# Patient Record
Sex: Male | Born: 1985 | Race: Black or African American | Hispanic: No | Marital: Single | Smoking: Current every day smoker
Health system: Southern US, Community
[De-identification: ages and names within clinical notes are randomized; demographics above are authoritative.]

## PROBLEM LIST (undated history)

## (undated) DIAGNOSIS — F319 Bipolar disorder, unspecified: Secondary | ICD-10-CM

## (undated) DIAGNOSIS — F4310 Post-traumatic stress disorder, unspecified: Secondary | ICD-10-CM

## (undated) DIAGNOSIS — F259 Schizoaffective disorder, unspecified: Secondary | ICD-10-CM

---
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
## 2007-08-09 ENCOUNTER — Emergency Department (HOSPITAL_COMMUNITY): Admission: EM | Admit: 2007-08-09 | Discharge: 2007-08-09 | Payer: Self-pay | Admitting: Emergency Medicine

## 2011-11-27 ENCOUNTER — Emergency Department (HOSPITAL_COMMUNITY): Payer: Self-pay

## 2011-11-27 ENCOUNTER — Emergency Department (HOSPITAL_COMMUNITY)
Admission: EM | Admit: 2011-11-27 | Discharge: 2011-11-27 | Disposition: A | Discharge status: Home / Self Care | Payer: Self-pay | Attending: Emergency Medicine | Admitting: Emergency Medicine

## 2011-11-27 ENCOUNTER — Encounter (HOSPITAL_COMMUNITY): Payer: Self-pay | Admitting: *Deleted

## 2011-11-27 DIAGNOSIS — J189 Pneumonia, unspecified organism: Secondary | ICD-10-CM

## 2011-11-27 DIAGNOSIS — F17200 Nicotine dependence, unspecified, uncomplicated: Secondary | ICD-10-CM | POA: Insufficient documentation

## 2011-11-27 DIAGNOSIS — B9789 Other viral agents as the cause of diseases classified elsewhere: Secondary | ICD-10-CM | POA: Insufficient documentation

## 2011-11-27 LAB — RAPID STREP SCREEN (MED CTR MEBANE ONLY): Streptococcus, Group A Screen (Direct): NEGATIVE

## 2011-11-27 MED ORDER — AZITHROMYCIN 250 MG PO TABS: 250.0000 mg | ORAL_TABLET | Freq: Every day | ORAL | Status: AC

## 2011-11-27 MED ORDER — SODIUM CHLORIDE 0.9 % IV BOLUS (SEPSIS)
1000.0000 mL | Freq: Once | INTRAVENOUS | Status: AC
  Administered 2011-11-27: 1000 mL via INTRAVENOUS

## 2011-11-27 MED ORDER — ACETAMINOPHEN 325 MG PO TABS
650.0000 mg | ORAL_TABLET | Freq: Once | ORAL | Status: AC
  Administered 2011-11-27: 650 mg via ORAL

## 2011-11-27 MED ORDER — AZITHROMYCIN 250 MG PO TABS
500.0000 mg | ORAL_TABLET | Freq: Once | ORAL | Status: AC
  Administered 2011-11-27: 500 mg via ORAL
  Filled 2011-11-27: qty 2

## 2011-11-27 MED ORDER — DEXTROSE 5 % IV SOLN
1.0000 g | Freq: Once | INTRAVENOUS | Status: AC
  Administered 2011-11-27: 1 g via INTRAVENOUS
  Filled 2011-11-27: qty 10

## 2011-11-27 MED ORDER — ACETAMINOPHEN 325 MG PO TABS
ORAL_TABLET | ORAL | Status: AC
  Filled 2011-11-27: qty 2

## 2011-11-27 MED ORDER — IBUPROFEN 600 MG PO TABS: 600.0000 mg | ORAL_TABLET | Freq: Four times a day (QID) | ORAL | Status: AC | PRN

## 2011-11-27 NOTE — ED Notes (Signed)
Pt c/o HA, fever, productive cough for the past 2 days.  States sputum is green.  Denies vomiting, SOB, CP.

## 2011-11-27 NOTE — ED Provider Notes (Signed)
History     CSN: [id number]  Arrival date & time 11/27/11  1944   First MD Initiated Contact with Patient 11/27/11 2007      Chief Complaint  Patient presents with  . URI  . Headache    (Consider location/radiation/quality/duration/timing/severity/associated sxs/prior treatment) HPI Comments: Patient states he's had fever, headache, myalgias, sore throat, nonproductive cough for the past 2, days.  He's been taking some unknown.  Over-the-counter medication without relief.  Last time.  He took any of this medication with 10 AM this morning.  He, states he's been eating, and drinking okay.  No nausea, vomiting, diarrhea.  He attempted to work today, but was unable to complete his symptoms  Patient is a 26 y.o. male presenting with URI and headaches. The history is provided by the patient.  URI The primary symptoms include fever, headaches, sore throat, cough and myalgias. Primary symptoms do not include nausea, vomiting or rash. The current episode started 2 days ago. This is a new problem.  Symptoms associated with the illness include congestion.  Headache  Associated symptoms include a fever. Pertinent negatives include no nausea and no vomiting.    History reviewed. No pertinent past medical history.  History reviewed. No pertinent past surgical history.  History reviewed. No pertinent family history.  History  Substance Use Topics  . Smoking status: Current Everyday Smoker -- 1.0 packs/day  . Smokeless tobacco: Not on file  . Alcohol Use: Yes      Review of Systems  Constitutional: Positive for fever.  HENT: Positive for congestion and sore throat.   Respiratory: Positive for cough.   Gastrointestinal: Negative for nausea and vomiting.  Genitourinary: Negative for dysuria.  Musculoskeletal: Positive for myalgias.  Skin: Negative for rash.  Neurological: Positive for dizziness and headaches.    Allergies  Review of patient's allergies indicates no known  allergies.  Home Medications   Current Outpatient Rx  Name Route Sig Dispense Refill  . IBUPROFEN 200 MG PO TABS Oral Take 400 mg by mouth every 6 (six) hours as needed. For pain      BP 146/88  Pulse 84  Temp 100.8 F (38.2 C)  Resp 16  SpO2 98%  Physical Exam  Constitutional: He is oriented to person, place, and time.  HENT:  Head: Normocephalic.  Mouth/Throat: Posterior oropharyngeal edema and posterior oropharyngeal erythema present. No oropharyngeal exudate.  Cardiovascular: Normal rate.   Pulmonary/Chest: Effort normal.  Musculoskeletal: Normal range of motion.  Neurological: He is alert and oriented to person, place, and time.  Skin: Skin is warm. No rash noted.    ED Course  Procedures (including critical care time)   Labs Reviewed  RAPID STREP SCREEN   No results found.   No diagnosis found.    MDM   That this is just a viral syndrome.  X-ray has been ordered through triage lungs sound with no wheezing, or diminished breath sounds.  I will hydrate, and treat with Tylenol, and reevaluates are clear        Arman Filter, NP 12/03/11 0211

## 2011-12-04 NOTE — ED Provider Notes (Signed)
Medical screening examination/treatment/procedure(s) were performed by non-physician practitioner and as supervising physician I was immediately available for consultation/collaboration.   Richardean Canal, MD 12/04/11 [phone]

## 2011-12-15 ENCOUNTER — Encounter (HOSPITAL_COMMUNITY): Payer: Self-pay | Admitting: Emergency Medicine

## 2011-12-15 ENCOUNTER — Emergency Department (HOSPITAL_COMMUNITY)
Admission: EM | Admit: 2011-12-15 | Discharge: 2011-12-15 | Disposition: A | Discharge status: Home / Self Care | Payer: No Typology Code available for payment source | Attending: Emergency Medicine | Admitting: Emergency Medicine

## 2011-12-15 ENCOUNTER — Emergency Department (HOSPITAL_COMMUNITY): Payer: Self-pay

## 2011-12-15 DIAGNOSIS — F17200 Nicotine dependence, unspecified, uncomplicated: Secondary | ICD-10-CM | POA: Insufficient documentation

## 2011-12-15 DIAGNOSIS — S139XXA Sprain of joints and ligaments of unspecified parts of neck, initial encounter: Secondary | ICD-10-CM | POA: Insufficient documentation

## 2011-12-15 DIAGNOSIS — S161XXA Strain of muscle, fascia and tendon at neck level, initial encounter: Secondary | ICD-10-CM

## 2011-12-15 DIAGNOSIS — Y92410 Unspecified street and highway as the place of occurrence of the external cause: Secondary | ICD-10-CM | POA: Insufficient documentation

## 2011-12-15 DIAGNOSIS — S335XXA Sprain of ligaments of lumbar spine, initial encounter: Secondary | ICD-10-CM | POA: Insufficient documentation

## 2011-12-15 DIAGNOSIS — S39012A Strain of muscle, fascia and tendon of lower back, initial encounter: Secondary | ICD-10-CM

## 2011-12-15 MED ORDER — IBUPROFEN 800 MG PO TABS: 800.0000 mg | ORAL_TABLET | Freq: Three times a day (TID) | ORAL | Status: AC | PRN
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
## 2011-12-16 NOTE — ED Provider Notes (Signed)
Medical screening examination/treatment/procedure(s) were performed by non-physician practitioner and as supervising physician I was immediately available for consultation/collaboration.   Lyanne Co, MD 12/16/11 [phone]

## 2013-09-03 ENCOUNTER — Emergency Department (HOSPITAL_COMMUNITY)
Admission: EM | Admit: 2013-09-03 | Discharge: 2013-09-03 | Disposition: A | Discharge status: Home / Self Care | Payer: No Typology Code available for payment source | Attending: Emergency Medicine | Admitting: Emergency Medicine

## 2013-09-03 ENCOUNTER — Encounter (HOSPITAL_COMMUNITY): Payer: Self-pay | Admitting: Emergency Medicine

## 2013-09-03 DIAGNOSIS — S61459A Open bite of unspecified hand, initial encounter: Secondary | ICD-10-CM

## 2013-09-03 DIAGNOSIS — F17200 Nicotine dependence, unspecified, uncomplicated: Secondary | ICD-10-CM | POA: Insufficient documentation

## 2013-09-03 DIAGNOSIS — Z23 Encounter for immunization: Secondary | ICD-10-CM | POA: Insufficient documentation

## 2013-09-03 DIAGNOSIS — W503XXA Accidental bite by another person, initial encounter: Secondary | ICD-10-CM

## 2013-09-03 DIAGNOSIS — S61409A Unspecified open wound of unspecified hand, initial encounter: Secondary | ICD-10-CM | POA: Insufficient documentation

## 2013-09-03 MED ORDER — AMOXICILLIN-POT CLAVULANATE 875-125 MG PO TABS: 1.0000 | ORAL_TABLET | Freq: Two times a day (BID) | ORAL | Status: DC

## 2013-09-03 MED ORDER — AMOXICILLIN-POT CLAVULANATE 875-125 MG PO TABS
1.0000 | ORAL_TABLET | Freq: Once | ORAL | Status: AC
  Administered 2013-09-03: 1 via ORAL
  Filled 2013-09-03: qty 1

## 2013-09-03 MED ORDER — TETANUS-DIPHTH-ACELL PERTUSSIS 5-2.5-18.5 LF-MCG/0.5 IM SUSP
0.5000 mL | Freq: Once | INTRAMUSCULAR | Status: AC
  Administered 2013-09-03: 0.5 mL via INTRAMUSCULAR
  Filled 2013-09-03: qty 0.5
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
## 2013-09-03 NOTE — ED Notes (Signed)
Pt escorted to discharge window. Pt verbalized understanding discharge instructions. In no acute distress.  

## 2013-09-06 NOTE — ED Provider Notes (Signed)
Medical screening examination/treatment/procedure(s) were performed by non-physician practitioner and as supervising physician I was immediately available for consultation/collaboration.   EKG Interpretation None        Candyce Churn III, MD 09/06/13 2049

## 2015-04-08 ENCOUNTER — Encounter (HOSPITAL_COMMUNITY): Payer: Self-pay

## 2015-04-08 ENCOUNTER — Emergency Department (HOSPITAL_COMMUNITY): Payer: Self-pay

## 2015-04-08 ENCOUNTER — Emergency Department (HOSPITAL_COMMUNITY)
Admission: EM | Admit: 2015-04-08 | Discharge: 2015-04-08 | Disposition: A | Discharge status: Home / Self Care | Payer: Self-pay | Attending: Emergency Medicine | Admitting: Emergency Medicine

## 2015-04-08 DIAGNOSIS — F17200 Nicotine dependence, unspecified, uncomplicated: Secondary | ICD-10-CM | POA: Insufficient documentation

## 2015-04-08 DIAGNOSIS — Z792 Long term (current) use of antibiotics: Secondary | ICD-10-CM | POA: Insufficient documentation

## 2015-04-08 DIAGNOSIS — Y9389 Activity, other specified: Secondary | ICD-10-CM | POA: Insufficient documentation

## 2015-04-08 DIAGNOSIS — S8392XA Sprain of unspecified site of left knee, initial encounter: Secondary | ICD-10-CM | POA: Insufficient documentation

## 2015-04-08 DIAGNOSIS — Y92810 Car as the place of occurrence of the external cause: Secondary | ICD-10-CM | POA: Insufficient documentation

## 2015-04-08 DIAGNOSIS — Y990 Civilian activity done for income or pay: Secondary | ICD-10-CM | POA: Insufficient documentation

## 2015-04-08 DIAGNOSIS — X58XXXA Exposure to other specified factors, initial encounter: Secondary | ICD-10-CM | POA: Insufficient documentation

## 2015-04-08 MED ORDER — TRAMADOL HCL 50 MG PO TABS: 50.0000 mg | ORAL_TABLET | Freq: Four times a day (QID) | ORAL | Status: DC | PRN
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
## 2015-04-08 NOTE — ED Notes (Signed)
Pt verbalized understanding of d/c instructions, prescriptions, and follow-up care. No further questions/concerns, VSS, assisted to lobby in wheelchair.  

## 2015-04-08 NOTE — ED Notes (Signed)
Patient transported to X-ray 

## 2015-04-08 NOTE — Discharge Instructions (Signed)
Rest and elevate your leg whenever possible for the next 2 days.  Ice for 20 minutes of every 2 hours while awake for the next 2 days.  Ibuprofen 600 mg every 6 hours as needed for pain. Tramadol as prescribed as needed for pain not relieved with ibuprofen.  Follow-up with your primary Dr. if not improving in the next 1-2 weeks.   Knee Sprain A knee sprain is a tear in one of the strong, fibrous tissues that connect the bones (ligaments) in your knee. The severity of the sprain depends on how much of the ligament is torn. The tear can be either partial or complete. CAUSES  Often, sprains are a result of a fall or injury. The force of the impact causes the fibers of your ligament to stretch too much. This excess tension causes the fibers of your ligament to tear. SIGNS AND SYMPTOMS  You may have some loss of motion in your knee. Other symptoms include:  Bruising.  Pain in the knee area.  Tenderness of the knee to the touch.  Swelling. DIAGNOSIS  To diagnose a knee sprain, your health care provider will physically examine your knee. Your health care provider may also suggest an X-ray exam of your knee to make sure no bones are broken. TREATMENT  If your ligament is only partially torn, treatment usually involves keeping the knee in a fixed position (immobilization) or bracing your knee for activities that require movement for several weeks. To do this, your health care provider will apply a bandage, cast, or splint to keep your knee from moving and to support your knee during movement until it heals. For a partially torn ligament, the healing process usually takes 4-6 weeks. If your ligament is completely torn, depending on which ligament it is, you may need surgery to reconnect the ligament to the bone or reconstruct it. After surgery, a cast or splint may be applied and will need to stay on your knee for 4-6 weeks while your ligament heals. HOME CARE INSTRUCTIONS  Keep your injured knee  elevated to decrease swelling.  To ease pain and swelling, apply ice to the injured area:  Put ice in a plastic bag.  Place a towel between your skin and the bag.  Leave the ice on for 20 minutes, 2-3 times a day.  Only take medicine for pain as directed by your health care provider.  Do not leave your knee unprotected until pain and stiffness go away (usually 4-6 weeks).  If you have a cast or splint, do not allow it to get wet. If you have been instructed not to remove it, cover it with a plastic bag when you shower or bathe. Do not swim.  Your health care provider may suggest exercises for you to do during your recovery to prevent or limit permanent weakness and stiffness. SEEK IMMEDIATE MEDICAL CARE IF:  Your cast or splint becomes damaged.  Your pain becomes worse.  You have significant pain, swelling, or numbness below the cast or splint. MAKE SURE YOU:  Understand these instructions.  Will watch your condition.  Will get help right away if you are not doing well or get worse.   This information is not intended to replace advice given to you by your health care provider. Make sure you discuss any questions you have with your health care provider.   Document Released: 03/26/2005 Document Revised: 04/16/2014 Document Reviewed: 11/05/2012 [employer] Interactive Patient Education [employer]2016 [employer]

## 2015-10-11 ENCOUNTER — Emergency Department (HOSPITAL_COMMUNITY): Payer: Self-pay

## 2015-10-11 ENCOUNTER — Emergency Department (HOSPITAL_COMMUNITY)
Admission: EM | Admit: 2015-10-11 | Discharge: 2015-10-11 | Disposition: A | Discharge status: Home / Self Care | Payer: Self-pay | Attending: Emergency Medicine | Admitting: Emergency Medicine

## 2015-10-11 ENCOUNTER — Encounter (HOSPITAL_COMMUNITY): Payer: Self-pay | Admitting: Emergency Medicine

## 2015-10-11 DIAGNOSIS — S80212A Abrasion, left knee, initial encounter: Secondary | ICD-10-CM | POA: Insufficient documentation

## 2015-10-11 DIAGNOSIS — Y999 Unspecified external cause status: Secondary | ICD-10-CM | POA: Insufficient documentation

## 2015-10-11 DIAGNOSIS — Y929 Unspecified place or not applicable: Secondary | ICD-10-CM | POA: Insufficient documentation

## 2015-10-11 DIAGNOSIS — F17210 Nicotine dependence, cigarettes, uncomplicated: Secondary | ICD-10-CM | POA: Insufficient documentation

## 2015-10-11 DIAGNOSIS — M7918 Myalgia, other site: Secondary | ICD-10-CM

## 2015-10-11 DIAGNOSIS — S022XXA Fracture of nasal bones, initial encounter for closed fracture: Secondary | ICD-10-CM | POA: Insufficient documentation

## 2015-10-11 DIAGNOSIS — Y939 Activity, unspecified: Secondary | ICD-10-CM | POA: Insufficient documentation

## 2015-10-11 LAB — I-STAT CHEM 8, ED
BUN: 5 mg/dL — AB (ref 6–20)
CHLORIDE: 104 mmol/L (ref 101–111)
Calcium, Ion: 1.13 mmol/L (ref 1.13–1.30)
Creatinine, Ser: 1.1 mg/dL (ref 0.61–1.24)
Glucose, Bld: 83 mg/dL (ref 65–99)
HCT: 43 % (ref 39.0–52.0)
Hemoglobin: 14.6 g/dL (ref 13.0–17.0)
POTASSIUM: 4.1 mmol/L (ref 3.5–5.1)
SODIUM: 141 mmol/L (ref 135–145)
TCO2: 28 mmol/L (ref 0–100)

## 2015-10-11 MED ORDER — MORPHINE SULFATE (PF) 4 MG/ML IV SOLN
4.0000 mg | Freq: Once | INTRAVENOUS | Status: AC
  Administered 2015-10-11: 4 mg via INTRAVENOUS
  Filled 2015-10-11: qty 1

## 2015-10-11 MED ORDER — OXYCODONE-ACETAMINOPHEN 5-325 MG PO TABS: 1.0000 | ORAL_TABLET | Freq: Four times a day (QID) | ORAL | Status: DC | PRN

## 2015-10-11 MED ORDER — ONDANSETRON HCL 4 MG/2ML IJ SOLN
4.0000 mg | Freq: Once | INTRAMUSCULAR | Status: AC
  Administered 2015-10-11: 4 mg via INTRAVENOUS
  Filled 2015-10-11: qty 2

## 2015-10-11 MED ORDER — IOPAMIDOL (ISOVUE-370) INJECTION 76%
INTRAVENOUS | Status: AC
  Administered 2015-10-11: 50 mL
  Filled 2015-10-11: qty 50
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
## 2017-01-14 ENCOUNTER — Emergency Department (HOSPITAL_COMMUNITY): Payer: Self-pay

## 2017-01-14 ENCOUNTER — Encounter (HOSPITAL_COMMUNITY): Payer: Self-pay | Admitting: *Deleted

## 2017-01-14 ENCOUNTER — Encounter (HOSPITAL_COMMUNITY): Payer: Self-pay | Admitting: Emergency Medicine

## 2017-01-14 ENCOUNTER — Emergency Department (HOSPITAL_COMMUNITY)
Admission: EM | Admit: 2017-01-14 | Discharge: 2017-01-14 | Disposition: A | Discharge status: Home / Self Care | Payer: Self-pay | Attending: Emergency Medicine | Admitting: Emergency Medicine

## 2017-01-14 DIAGNOSIS — S21219A Laceration without foreign body of unspecified back wall of thorax without penetration into thoracic cavity, initial encounter: Secondary | ICD-10-CM | POA: Insufficient documentation

## 2017-01-14 DIAGNOSIS — Y999 Unspecified external cause status: Secondary | ICD-10-CM | POA: Insufficient documentation

## 2017-01-14 DIAGNOSIS — S29001A Unspecified injury of muscle and tendon of front wall of thorax, initial encounter: Secondary | ICD-10-CM | POA: Insufficient documentation

## 2017-01-14 DIAGNOSIS — F17200 Nicotine dependence, unspecified, uncomplicated: Secondary | ICD-10-CM | POA: Insufficient documentation

## 2017-01-14 DIAGNOSIS — T148XXA Other injury of unspecified body region, initial encounter: Secondary | ICD-10-CM

## 2017-01-14 DIAGNOSIS — S3991XA Unspecified injury of abdomen, initial encounter: Secondary | ICD-10-CM | POA: Insufficient documentation

## 2017-01-14 DIAGNOSIS — Y939 Activity, unspecified: Secondary | ICD-10-CM | POA: Insufficient documentation

## 2017-01-14 DIAGNOSIS — Y929 Unspecified place or not applicable: Secondary | ICD-10-CM | POA: Insufficient documentation

## 2017-01-14 LAB — CBC
HCT: 41.7 % (ref 39.0–52.0)
Hemoglobin: 13.8 g/dL (ref 13.0–17.0)
MCH: 28.2 pg (ref 26.0–34.0)
MCHC: 33.1 g/dL (ref 30.0–36.0)
MCV: 85.1 fL (ref 78.0–100.0)
PLATELETS: 396 10*3/uL (ref 150–400)
RBC: 4.9 MIL/uL (ref 4.22–5.81)
RDW: 13.8 % (ref 11.5–15.5)
WBC: 17.9 10*3/uL — AB (ref 4.0–10.5)

## 2017-01-14 LAB — PREPARE FRESH FROZEN PLASMA
UNIT DIVISION: 0
UNIT DIVISION: 0

## 2017-01-14 LAB — COMPREHENSIVE METABOLIC PANEL
ALBUMIN: 4.5 g/dL (ref 3.5–5.0)
ALT: 15 U/L — AB (ref 17–63)
AST: 30 U/L (ref 15–41)
Alkaline Phosphatase: 84 U/L (ref 38–126)
Anion gap: 21 — ABNORMAL HIGH (ref 5–15)
BUN: 8 mg/dL (ref 6–20)
CHLORIDE: 104 mmol/L (ref 101–111)
CO2: 18 mmol/L — AB (ref 22–32)
CREATININE: 1.31 mg/dL — AB (ref 0.61–1.24)
Calcium: 9.2 mg/dL (ref 8.9–10.3)
GFR calc Af Amer: >60 mL/min (ref >60)
GLUCOSE: 75 mg/dL (ref 65–99)
Potassium: 3.5 mmol/L (ref 3.5–5.1)
Sodium: 143 mmol/L (ref 135–145)
Total Bilirubin: 0.5 mg/dL (ref 0.3–1.2)
Total Protein: 7.9 g/dL (ref 6.5–8.1)

## 2017-01-14 LAB — BPAM RBC
BLOOD PRODUCT EXPIRATION DATE: 201810222359
BLOOD PRODUCT EXPIRATION DATE: 201810282359
ISSUE DATE / TIME: 201810080306
ISSUE DATE / TIME: 201810080306
UNIT TYPE AND RH: 9500
UNIT TYPE AND RH: 9500

## 2017-01-14 LAB — TYPE AND SCREEN
ABO/RH(D): B POS
Antibody Screen: NEGATIVE
UNIT DIVISION: 0
Unit division: 0

## 2017-01-14 LAB — BPAM FFP
BLOOD PRODUCT EXPIRATION DATE: 201810112359
Blood Product Expiration Date: 201810122359
ISSUE DATE / TIME: 201810080307
ISSUE DATE / TIME: 201810080307
UNIT TYPE AND RH: 6200
Unit Type and Rh: 6200

## 2017-01-14 LAB — ETHANOL: ALCOHOL ETHYL (B): 223 mg/dL — AB (ref <10)

## 2017-01-14 LAB — ABO/RH: ABO/RH(D): B POS

## 2017-01-14 MED ORDER — LIDOCAINE-EPINEPHRINE (PF) 2 %-1:200000 IJ SOLN
20.0000 mL | Freq: Once | INTRAMUSCULAR | Status: AC
  Administered 2017-01-14: 20 mL
  Filled 2017-01-14: qty 20

## 2017-01-14 MED ORDER — OXYCODONE-ACETAMINOPHEN 5-325 MG PO TABS
1.0000 | ORAL_TABLET | ORAL | Status: AC
  Administered 2017-01-14: 1 via ORAL
  Filled 2017-01-14: qty 1

## 2017-01-14 MED ORDER — IOPAMIDOL (ISOVUE-300) INJECTION 61%
INTRAVENOUS | Status: AC
  Administered 2017-01-14: 100 mL
  Filled 2017-01-14: qty 100

## 2017-01-14 MED ORDER — IBUPROFEN 800 MG PO TABS: 800.0000 mg | ORAL_TABLET | Freq: Three times a day (TID) | ORAL | 0 refills | Status: DC

## 2017-01-14 MED ORDER — TRAMADOL HCL 50 MG PO TABS: 50.0000 mg | ORAL_TABLET | Freq: Four times a day (QID) | ORAL | 0 refills | Status: DC | PRN
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
## 2017-01-14 NOTE — Discharge Instructions (Addendum)
Go to urgent care in 10 days to have staples removed

## 2017-01-14 NOTE — ED Provider Notes (Signed)
  Physical Exam  BP 124/85   Pulse 92   Temp 97.9 F (36.6 C) (Temporal)   Resp 17   Ht  (1.676 m)   Wt 74.8 kg (165 lb)   SpO2 96%   BMI 26.63 kg/m   Physical Exam  ED Course  .Marland KitchenLaceration Repair Date/Time: 01/14/2017 5:20 AM Performed by: Melody Comas Authorized by: Roxy Horseman   Consent:    Consent obtained:  Verbal   Consent given by:  Patient   Risks discussed:  Poor cosmetic result, poor wound healing, infection and pain   Alternatives discussed:  No treatment Anesthesia (see MAR for exact dosages):    Anesthesia method:  Local infiltration   Local anesthetic:  Lidocaine 1% WITH epi Laceration details:    Location:  Trunk   Trunk location:  L flank   Length (cm):  6 Repair type:    Repair type:  Simple Pre-procedure details:    Preparation:  Patient was prepped and draped in usual sterile fashion Exploration:    Hemostasis achieved with:  Direct pressure   Wound exploration: wound explored through full range of motion     Wound extent: no foreign bodies/material noted, no muscle damage noted and no underlying fracture noted     Contaminated: no   Treatment:    Area cleansed with:  Saline   Amount of cleaning:  Standard   Irrigation solution:  Sterile water   Irrigation volume:  500 ml   Irrigation method:  Syringe   Visualized foreign bodies/material removed: no   Skin repair:    Repair method:  Staples   Number of staples:  9 Approximation:    Approximation:  Close Post-procedure details:    Dressing:  Adhesive bandage and antibiotic ointment   Patient tolerance of procedure:  Tolerated well, no immediate complications    .Marland KitchenLaceration Repair Date/Time: 01/14/2017 5:20 AM Performed by: Sharilyn Sites Authorized by: Roxy Horseman   Consent:    Consent obtained:  Verbal   Consent given by:  Patient   Risks discussed:  Poor cosmetic result, poor wound healing, infection and pain   Alternatives discussed:  No treatment Anesthesia  (see MAR for exact dosages):    Anesthesia method:  Local infiltration   Local anesthetic:  Lidocaine 1% WITH epi Laceration details:    Location:  Trunk   Trunk location:  L flank   Length (cm):  3 Repair type:    Repair type:  Simple Pre-procedure details:    Preparation:  Patient was prepped and draped in usual sterile fashion Exploration:    Hemostasis achieved with:  Direct pressure   Wound exploration: wound explored through full range of motion     Wound extent: no foreign bodies/material noted, no muscle damage noted and no underlying fracture noted     Contaminated: no   Treatment:    Area cleansed with:  Saline   Amount of cleaning:  Standard   Irrigation solution:  Sterile water   Irrigation volume:  500 ml   Irrigation method:  Syringe   Visualized foreign bodies/material removed: no   Skin repair:    Repair method:  Staples (5) Approximation:    Approximation:  Close Post-procedure details:    Dressing:  Adhesive bandage and antibiotic ointment   Patient tolerance of procedure:  Tolerated well, no immediate complications             Roxy Horseman, PA-C 01/14/17 0525    Gilda Crease, MD 01/14/17 [phone]
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
## 2017-01-27 ENCOUNTER — Emergency Department (HOSPITAL_COMMUNITY)
Admission: EM | Admit: 2017-01-27 | Discharge: 2017-01-27 | Disposition: A | Discharge status: Home / Self Care | Payer: Self-pay | Attending: Emergency Medicine | Admitting: Emergency Medicine

## 2017-01-27 ENCOUNTER — Encounter (HOSPITAL_COMMUNITY): Payer: Self-pay | Admitting: Emergency Medicine

## 2017-01-27 DIAGNOSIS — Z4802 Encounter for removal of sutures: Secondary | ICD-10-CM | POA: Insufficient documentation

## 2017-01-27 DIAGNOSIS — F17210 Nicotine dependence, cigarettes, uncomplicated: Secondary | ICD-10-CM | POA: Insufficient documentation

## 2017-01-27 DIAGNOSIS — X58XXXD Exposure to other specified factors, subsequent encounter: Secondary | ICD-10-CM | POA: Insufficient documentation

## 2017-01-27 DIAGNOSIS — T148XXD Other injury of unspecified body region, subsequent encounter: Secondary | ICD-10-CM | POA: Insufficient documentation
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
## 2017-01-27 NOTE — Discharge Instructions (Signed)
Contact a health care provider if: °You have increasing redness, swelling, or pain in the wound. °You see pus coming from the wound. °You have a fever. °You notice a bad smell coming from the wound or dressing. °Your wound breaks open (edges not staying together). °

## 2017-02-01 ENCOUNTER — Encounter (HOSPITAL_COMMUNITY): Payer: Self-pay | Admitting: Emergency Medicine

## 2017-02-01 ENCOUNTER — Emergency Department (HOSPITAL_COMMUNITY)
Admission: EM | Admit: 2017-02-01 | Discharge: 2017-02-02 | Disposition: A | Discharge status: Home / Self Care | Payer: Self-pay | Attending: Emergency Medicine | Admitting: Emergency Medicine

## 2017-02-01 DIAGNOSIS — T50902A Poisoning by unspecified drugs, medicaments and biological substances, intentional self-harm, initial encounter: Secondary | ICD-10-CM | POA: Insufficient documentation

## 2017-02-01 DIAGNOSIS — Z79899 Other long term (current) drug therapy: Secondary | ICD-10-CM | POA: Insufficient documentation

## 2017-02-01 DIAGNOSIS — F17210 Nicotine dependence, cigarettes, uncomplicated: Secondary | ICD-10-CM | POA: Insufficient documentation

## 2017-02-01 DIAGNOSIS — Y929 Unspecified place or not applicable: Secondary | ICD-10-CM | POA: Insufficient documentation

## 2017-02-01 DIAGNOSIS — R45851 Suicidal ideations: Secondary | ICD-10-CM | POA: Insufficient documentation

## 2017-02-01 DIAGNOSIS — Z046 Encounter for general psychiatric examination, requested by authority: Secondary | ICD-10-CM | POA: Insufficient documentation

## 2017-02-01 DIAGNOSIS — X58XXXA Exposure to other specified factors, initial encounter: Secondary | ICD-10-CM | POA: Insufficient documentation

## 2017-02-01 DIAGNOSIS — Y9389 Activity, other specified: Secondary | ICD-10-CM | POA: Insufficient documentation

## 2017-02-01 DIAGNOSIS — Y999 Unspecified external cause status: Secondary | ICD-10-CM | POA: Insufficient documentation

## 2017-02-01 DIAGNOSIS — F1014 Alcohol abuse with alcohol-induced mood disorder: Secondary | ICD-10-CM | POA: Diagnosis present

## 2017-02-01 DIAGNOSIS — T1491XA Suicide attempt, initial encounter: Secondary | ICD-10-CM | POA: Insufficient documentation

## 2017-02-01 DIAGNOSIS — F10920 Alcohol use, unspecified with intoxication, uncomplicated: Secondary | ICD-10-CM | POA: Insufficient documentation

## 2017-02-01 MED ORDER — NICOTINE 21 MG/24HR TD PT24
21.0000 mg | MEDICATED_PATCH | Freq: Every day | TRANSDERMAL | Status: DC
  Administered 2017-02-02 (×2): 21 mg via TRANSDERMAL
  Filled 2017-02-01 (×2): qty 1

## 2017-02-01 MED ORDER — ALUM & MAG HYDROXIDE-SIMETH 200-200-20 MG/5ML PO SUSP: 30.0000 mL | Freq: Four times a day (QID) | ORAL | Status: DC | PRN

## 2017-02-01 MED ORDER — ONDANSETRON HCL 4 MG PO TABS: 4.0000 mg | ORAL_TABLET | Freq: Three times a day (TID) | ORAL | Status: DC | PRN

## 2017-02-01 MED ORDER — ONDANSETRON HCL 4 MG/2ML IJ SOLN
4.0000 mg | Freq: Once | INTRAMUSCULAR | Status: AC
  Administered 2017-02-02: 4 mg via INTRAVENOUS
  Filled 2017-02-01: qty 2

## 2017-02-01 MED ORDER — PANTOPRAZOLE SODIUM 40 MG IV SOLR
40.0000 mg | Freq: Once | INTRAVENOUS | Status: AC
  Administered 2017-02-02: 40 mg via INTRAVENOUS
  Filled 2017-02-01: qty 40

## 2017-02-01 NOTE — ED Notes (Signed)
Called Poison Control wants initial Ekg, @ 0145 acetaminophen level and a cmp. If it less than 150 mcg/ml then he can be cleared.

## 2017-02-01 NOTE — ED Notes (Signed)
Bed: WA17 Expected date:  Expected time:  Means of arrival:  Comments: overdose

## 2017-02-01 NOTE — ED Notes (Signed)
Requested patient to urinate. 

## 2017-02-01 NOTE — ED Notes (Signed)
Pt refused getting changed into burgundy scrubs.

## 2017-02-01 NOTE — ED Provider Notes (Signed)
[facility] DEPT Provider Note: John Leblanc John Sweeney, MD, FACEP  CSN: [id number] MRN: [id number] ARRIVAL: 02/01/17 at 2254 ROOM: [facility]   CHIEF COMPLAINT  Suicide Attempt and IVC   HISTORY OF PRESENT ILLNESS  02/01/17 11:22 PM John ReveringCharles E Leblanc is a 31 y.o. male with a long-standing history of depression.  He relates this to joblessness, separation from his wife for the past 6 months, loss of a child due to stillbirth and inability to care for his family.  He has been having feelings of worthlessness and suicidality for an extended time.  This evening he took about 10 extra strength Tylenol PM tablets in an attempt to kill himself.  He estimates he did this between 9 and 10 PM.  He had originally thought of taking a knife to his neck but shows a less violent method.  He is complaining of nausea and diffuse abdominal pain which he states is chronic.  He admits to one beer about 4 PM.   History reviewed. No pertinent past medical history.  History reviewed. No pertinent surgical history.  History reviewed. No pertinent family history.  Social History  Substance Use Topics  . Smoking status: Current Every Day Smoker    Packs/day: 0.50  . Smokeless tobacco: Current User  . Alcohol use Yes    Prior to Admission medications   Medication Sig Start Date End Date Taking? Authorizing Provider  diphenhydramine-acetaminophen (TYLENOL PM) 25-500 MG TABS tablet Take 1 tablet by mouth at bedtime as needed (sleep).   Yes [provider]  ibuprofen (ADVIL,MOTRIN) 800 MG tablet Take 1 tablet (800 mg total) by mouth 3 (three) times daily. 01/14/17  Yes Pollina, Canary Brimhristopher J, MD    Allergies Patient has no known allergies.   REVIEW OF SYSTEMS  Negative except as noted here or in the History of Present Illness.   PHYSICAL EXAMINATION  Initial Vital Signs Blood pressure (!) 110/55, pulse 88, temperature 98 F (36.7 C), temperature source Oral, resp. rate 16, height 5\' 6"  (1.676  m), weight 70.3 kg (155 lb), SpO2 97 %.  Examination General: Well-developed, well-nourished male in no acute distress; appearance consistent with age of record HENT: normocephalic; atraumatic Eyes: pupils equal, round and reactive to light; extraocular muscles intact Neck: supple Heart: regular rate and rhythm Lungs: clear to auscultation bilaterally Abdomen: soft; nondistended; diffusely tender; no masses or hepatosplenomegaly; bowel sounds present Extremities: No deformity; full range of motion; pulses normal Neurologic: Awake, alert and oriented; motor function intact in all extremities and symmetric; no facial droop Skin: Warm and dry Psychiatric: Depressed mood with congruent affect; suicidal ideation   RESULTS   Nursing notes and vitals signs, including pulse oximetry, reviewed.  Summary of this visit's results, reviewed by myself:  EKG:  EKG Interpretation  Date/Time:    Ventricular Rate:    PR Interval:    QRS Duration:   QT Interval:    QTC Calculation:   R Axis:     Text Interpretation:         Labs:  Results for orders placed or performed during the hospital encounter of 02/01/17 (from the past 24 hour(s))  Ethanol     Status: Abnormal   Collection Time: 02/01/17 11:35 PM  Result Value Ref Range   Alcohol, Ethyl (B) 114 (H) <10 mg/dL  Salicylate level     Status: None   Collection Time: 02/01/17 11:35 PM  Result Value Ref Range   Salicylate Lvl <7.0 2.8 - 30.0 mg/dL  Acetaminophen level  Status: Abnormal   Collection Time: 02/01/17 11:35 PM  Result Value Ref Range   Acetaminophen (Tylenol), Serum 36 (H) 10 - 30 ug/mL  Comprehensive metabolic panel     Status: Abnormal   Collection Time: 02/01/17 11:35 PM  Result Value Ref Range   Sodium 144 135 - 145 mmol/L   Potassium 4.4 3.5 - 5.1 mmol/L   Chloride 107 101 - 111 mmol/L   CO2 26 22 - 32 mmol/L   Glucose, Bld 96 65 - 99 mg/dL   BUN 11 6 - 20 mg/dL   Creatinine, Ser 6.57 0.61 - 1.24 mg/dL    Calcium 9.1 8.9 - 84.6 mg/dL   Total Protein 8.3 (H) 6.5 - 8.1 g/dL   Albumin 4.3 3.5 - 5.0 g/dL   AST 22 15 - 41 U/L   ALT 12 (L) 17 - 63 U/L   Alkaline Phosphatase 90 38 - 126 U/L   Total Bilirubin 0.5 0.3 - 1.2 mg/dL   GFR calc non Af Amer >60 >60 mL/min   GFR calc Af Amer >60 >60 mL/min   Anion gap 11 5 - 15  CBC with Differential/Platelet     Status: Abnormal   Collection Time: 02/01/17 11:35 PM  Result Value Ref Range   WBC 15.7 (H) 4.0 - 10.5 K/uL   RBC 4.78 4.22 - 5.81 MIL/uL   Hemoglobin 13.7 13.0 - 17.0 g/dL   HCT 96.2 95.2 - 84.1 %   MCV 85.8 78.0 - 100.0 fL   MCH 28.7 26.0 - 34.0 pg   MCHC 33.4 30.0 - 36.0 g/dL   RDW 32.4 40.1 - 02.7 %   Platelets 423 (H) 150 - 400 K/uL   Neutrophils Relative % 74 %   Neutro Abs 11.5 (H) 1.7 - 7.7 K/uL   Lymphocytes Relative 19 %   Lymphs Abs 3.0 0.7 - 4.0 K/uL   Monocytes Relative 6 %   Monocytes Absolute 0.9 0.1 - 1.0 K/uL   Eosinophils Relative 1 %   Eosinophils Absolute 0.2 0.0 - 0.7 K/uL   Basophils Relative 0 %   Basophils Absolute 0.1 0.0 - 0.1 K/uL  Acetaminophen level     Status: None   Collection Time: 02/02/17  1:39 AM  Result Value Ref Range   Acetaminophen (Tylenol), Serum 27 10 - 30 ug/mL    Imaging Studies: No results found.   ED COURSE  Nursing notes and initial vitals signs, including pulse oximetry, reviewed.  Vitals:   02/01/17 2307 02/01/17 2316 02/02/17 0055 02/02/17 0648  BP: (!) 110/55  (!) 147/96 123/70  Pulse: 88  82 (!) 58  Resp: 16  17 18   Temp: 98 F (36.7 C)   98.5 F (36.9 C)  TempSrc: Oral   Oral  SpO2: 97%  97% 100%  Weight:  70.3 kg (155 lb)    Height:  5\' 6"  (1.676 m)     12:25 AM Patient involuntarily committed.  TTS has assessed him and deemed him appropriate for inpatient treatment.   PROCEDURES    ED DIAGNOSES     ICD-10-CM   1. Intentional drug overdose, initial encounter (HCC) T50.902A   2. Suicide attempt (HCC) T14.91XA   3. Suicidal ideation R45.851   4.  Alcoholic intoxication without complication (HCC) F10.920        Hargis Vandyne, Jonny Ruiz, MD 02/02/17 [phone]
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
## 2017-02-02 DIAGNOSIS — F1014 Alcohol abuse with alcohol-induced mood disorder: Secondary | ICD-10-CM | POA: Diagnosis present

## 2017-02-02 LAB — CBC WITH DIFFERENTIAL/PLATELET
BASOS ABS: 0.1 10*3/uL (ref 0.0–0.1)
BASOS PCT: 0 %
EOS ABS: 0.2 10*3/uL (ref 0.0–0.7)
Eosinophils Relative: 1 %
HEMATOCRIT: 41 % (ref 39.0–52.0)
HEMOGLOBIN: 13.7 g/dL (ref 13.0–17.0)
Lymphocytes Relative: 19 %
Lymphs Abs: 3 10*3/uL (ref 0.7–4.0)
MCH: 28.7 pg (ref 26.0–34.0)
MCHC: 33.4 g/dL (ref 30.0–36.0)
MCV: 85.8 fL (ref 78.0–100.0)
MONO ABS: 0.9 10*3/uL (ref 0.1–1.0)
Monocytes Relative: 6 %
NEUTROS ABS: 11.5 10*3/uL — AB (ref 1.7–7.7)
NEUTROS PCT: 74 %
Platelets: 423 10*3/uL — ABNORMAL HIGH (ref 150–400)
RBC: 4.78 MIL/uL (ref 4.22–5.81)
RDW: 14.6 % (ref 11.5–15.5)
WBC: 15.7 10*3/uL — ABNORMAL HIGH (ref 4.0–10.5)

## 2017-02-02 LAB — COMPREHENSIVE METABOLIC PANEL
ALK PHOS: 90 U/L (ref 38–126)
ALT: 12 U/L — AB (ref 17–63)
ANION GAP: 11 (ref 5–15)
AST: 22 U/L (ref 15–41)
Albumin: 4.3 g/dL (ref 3.5–5.0)
BILIRUBIN TOTAL: 0.5 mg/dL (ref 0.3–1.2)
BUN: 11 mg/dL (ref 6–20)
CO2: 26 mmol/L (ref 22–32)
CREATININE: 1.17 mg/dL (ref 0.61–1.24)
Calcium: 9.1 mg/dL (ref 8.9–10.3)
Chloride: 107 mmol/L (ref 101–111)
GFR calc non Af Amer: >60 mL/min (ref >60)
Glucose, Bld: 96 mg/dL (ref 65–99)
Potassium: 4.4 mmol/L (ref 3.5–5.1)
Sodium: 144 mmol/L (ref 135–145)
TOTAL PROTEIN: 8.3 g/dL — AB (ref 6.5–8.1)

## 2017-02-02 LAB — RAPID URINE DRUG SCREEN, HOSP PERFORMED
Amphetamines: NOT DETECTED
BARBITURATES: NOT DETECTED
BENZODIAZEPINES: NOT DETECTED
COCAINE: NOT DETECTED
OPIATES: NOT DETECTED
Tetrahydrocannabinol: POSITIVE — AB

## 2017-02-02 LAB — ETHANOL: ALCOHOL ETHYL (B): 114 mg/dL — AB (ref <10)

## 2017-02-02 LAB — SALICYLATE LEVEL: Salicylate Lvl: <7 mg/dL (ref 2.8–30.0)

## 2017-02-02 LAB — ACETAMINOPHEN LEVEL
Acetaminophen (Tylenol), Serum: 36 ug/mL — ABNORMAL HIGH (ref 10–30)
Acetaminophen (Tylenol), Serum: 27 ug/mL (ref 10–30)

## 2017-02-02 MED ORDER — GABAPENTIN 100 MG PO CAPS
200.0000 mg | ORAL_CAPSULE | Freq: Two times a day (BID) | ORAL | Status: DC
  Administered 2017-02-02: 200 mg via ORAL
  Filled 2017-02-02: qty 2

## 2017-02-02 MED ORDER — GABAPENTIN 100 MG PO CAPS: 200.0000 mg | ORAL_CAPSULE | Freq: Two times a day (BID) | ORAL | 0 refills | Status: DC

## 2017-02-02 NOTE — ED Notes (Signed)
[facility] Expected date:  Expected time:  Means of arrival:  Comments: Hold for 17
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
## 2017-02-02 NOTE — ED Notes (Signed)
Patient refusing to give urinate

## 2017-02-02 NOTE — ED Notes (Signed)
Pt d/c home per MD order. Discharge summary reviewed with pt. Pt verbalizes understanding. RX provided. Pt denies SI/HI/AVH. Pt signed for personal property and property returned. Pt signed e-signature. Ambulatory off unit with MHT.  

## 2017-02-02 NOTE — [facility] Suicide Risk Assessment (Signed)
Suicide Risk Assessment  Discharge Assessment   [facility]   Principal Problem: Alcohol abuse with alcohol-induced mood disorder [facility](HCC) Discharge Diagnoses:  Patient Active Problem List   Diagnosis Date Noted  . Alcohol abuse with alcohol-induced mood disorder (HCC) [F10.14] 02/02/2017    Priority: High    Total Time spent with patient: 45 minutes  Musculoskeletal: Strength & Muscle Tone: within normal limits Gait & Station: normal Patient leans: N/A  Psychiatric Specialty Exam:   Blood pressure 123/70, pulse (!) 58, temperature 98.5 F (36.9 C), temperature source Oral, resp. rate 18, height 5\' 6"  (1.676 m), weight 70.3 kg (155 lb), SpO2 100 %.Body mass index is 25.02 kg/m.  General Appearance: Casual  Eye Contact::  Good  Speech:  Normal Rate409  Volume:  Normal  Mood:  Euthymic  Affect:  Congruent  Thought Process:  Coherent and Descriptions of Associations: Intact  Orientation:  Full (Time, Place, and Person)  Thought Content:  WDL and Logical  Suicidal Thoughts:  No  Homicidal Thoughts:  No  Memory:  Immediate;   Good Recent;   Good Remote;   Good  Judgement:  Fair  Insight:  Fair  Psychomotor Activity:  Normal  Concentration:  Good  Recall:  Good  Fund of Knowledge:Good  Language: Good  Akathisia:  No  Handed:  Right  AIMS (if indicated):     Assets:  Leisure Time Physical Health Resilience Social Support  Sleep:     Cognition: WNL  ADL's:  Intact   Mental Status Per Nursing Assessment::   On Admission:   31 yo male who presented to the ED after drinking and having suicidal ideations.  Today, he is clear and coherent with no suicidal ideations or past attempt, no homicidal ideations, hallucinations, or withdrawal symptoms.  Agreeable to meet with Peer Support even though he does not feel like alcohol is an issue with him.  He requests to leave, stable for discharge.  Demographic Factors:  Male  Loss Factors: Legal  issues  Historical Factors: NA  Risk Reduction Factors:   Sense of responsibility to family, Living with another person, especially a relative and Positive social support  Continued Clinical Symptoms:  None  Cognitive Features That Contribute To Risk:  None    Suicide Risk:  Minimal: No identifiable suicidal ideation.  Patients presenting with no risk factors but with morbid ruminations; may be classified as minimal risk based on the severity of the depressive symptoms    Plan Of Care/Follow-up recommendations:  Activity:  as tolerated Diet:  heart healthy diet  Ruth Kovich, NP 02/02/2017, 11:09 AM

## 2017-02-02 NOTE — ED Notes (Signed)
Peer support at bedside 
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
## 2017-02-02 NOTE — ED Notes (Signed)
Patient admitted to the unit. Denies pain, SI/HI, AH/VH at this time. Responds "NO" to every question asked. Will continue to monitor patient.

## 2017-02-03 ENCOUNTER — Ambulatory Visit (HOSPITAL_COMMUNITY): Payer: Self-pay

## 2017-02-03 NOTE — Final Progress Note (Signed)
CPSS met with Pt at his place of residence and was able to speak with him and his mother about the issues Pt is having. CPSS discussed the plan of care goal for Pt until he can get himself into a facility or support group. CPSS addressed the fact that CPSS will follow up with Pt on the following day.

## 2017-04-21 ENCOUNTER — Ambulatory Visit (HOSPITAL_COMMUNITY): Payer: Self-pay
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
## 2017-04-21 NOTE — Patient Instructions (Signed)
Pt has to stay in contact with CPSS after he speaks with his probation officer. Pt needs to continue to work on bettering the quality of his life.

## 2018-01-08 ENCOUNTER — Emergency Department (HOSPITAL_COMMUNITY): Payer: Self-pay

## 2018-01-08 ENCOUNTER — Other Ambulatory Visit: Payer: Self-pay

## 2018-01-08 ENCOUNTER — Emergency Department (HOSPITAL_COMMUNITY)
Admission: EM | Admit: 2018-01-08 | Discharge: 2018-01-08 | Disposition: A | Discharge status: Home / Self Care | Payer: Self-pay | Attending: Emergency Medicine | Admitting: Emergency Medicine

## 2018-01-08 ENCOUNTER — Encounter (HOSPITAL_COMMUNITY): Payer: Self-pay | Admitting: Emergency Medicine

## 2018-01-08 DIAGNOSIS — Z79899 Other long term (current) drug therapy: Secondary | ICD-10-CM | POA: Insufficient documentation

## 2018-01-08 DIAGNOSIS — F17210 Nicotine dependence, cigarettes, uncomplicated: Secondary | ICD-10-CM | POA: Insufficient documentation

## 2018-01-08 DIAGNOSIS — F1210 Cannabis abuse, uncomplicated: Secondary | ICD-10-CM | POA: Insufficient documentation

## 2018-01-08 DIAGNOSIS — R112 Nausea with vomiting, unspecified: Secondary | ICD-10-CM | POA: Insufficient documentation

## 2018-01-08 DIAGNOSIS — F17228 Nicotine dependence, chewing tobacco, with other nicotine-induced disorders: Secondary | ICD-10-CM | POA: Insufficient documentation

## 2018-01-08 DIAGNOSIS — K920 Hematemesis: Secondary | ICD-10-CM | POA: Insufficient documentation

## 2018-01-08 DIAGNOSIS — R1084 Generalized abdominal pain: Secondary | ICD-10-CM | POA: Insufficient documentation

## 2018-01-08 LAB — CBC
HCT: 38.4 % — ABNORMAL LOW (ref 39.0–52.0)
HEMOGLOBIN: 12.5 g/dL — AB (ref 13.0–17.0)
MCH: 28.5 pg (ref 26.0–34.0)
MCHC: 32.6 g/dL (ref 30.0–36.0)
MCV: 87.5 fL (ref 78.0–100.0)
Platelets: 378 10*3/uL (ref 150–400)
RBC: 4.39 MIL/uL (ref 4.22–5.81)
RDW: 13.5 % (ref 11.5–15.5)
WBC: 9.7 10*3/uL (ref 4.0–10.5)

## 2018-01-08 LAB — COMPREHENSIVE METABOLIC PANEL
ALK PHOS: 77 U/L (ref 38–126)
ALT: 12 U/L (ref 0–44)
AST: 18 U/L (ref 15–41)
Albumin: 3.7 g/dL (ref 3.5–5.0)
Anion gap: 7 (ref 5–15)
BILIRUBIN TOTAL: 0.3 mg/dL (ref 0.3–1.2)
BUN: 9 mg/dL (ref 6–20)
CALCIUM: 8.2 mg/dL — AB (ref 8.9–10.3)
CO2: 24 mmol/L (ref 22–32)
Chloride: 111 mmol/L (ref 98–111)
Creatinine, Ser: 1.09 mg/dL (ref 0.61–1.24)
GFR calc Af Amer: >60 mL/min (ref >60)
GFR calc non Af Amer: >60 mL/min (ref >60)
GLUCOSE: 107 mg/dL — AB (ref 70–99)
Potassium: 3.7 mmol/L (ref 3.5–5.1)
SODIUM: 142 mmol/L (ref 135–145)
Total Protein: 6.9 g/dL (ref 6.5–8.1)

## 2018-01-08 LAB — URINALYSIS, ROUTINE W REFLEX MICROSCOPIC
BILIRUBIN URINE: NEGATIVE
Glucose, UA: NEGATIVE mg/dL
Hgb urine dipstick: NEGATIVE
KETONES UR: NEGATIVE mg/dL
Leukocytes, UA: NEGATIVE
Nitrite: NEGATIVE
Protein, ur: NEGATIVE mg/dL
SPECIFIC GRAVITY, URINE: 1.021 (ref 1.005–1.030)
pH: 5 (ref 5.0–8.0)

## 2018-01-08 LAB — PROTIME-INR
INR: 0.96
Prothrombin Time: 12.7 seconds (ref 11.4–15.2)

## 2018-01-08 LAB — LIPASE, BLOOD: Lipase: 53 U/L — ABNORMAL HIGH (ref 11–51)

## 2018-01-08 MED ORDER — PANTOPRAZOLE SODIUM 40 MG PO TBEC: 40.0000 mg | DELAYED_RELEASE_TABLET | Freq: Every day | ORAL | 0 refills | Status: DC

## 2018-01-08 MED ORDER — SODIUM CHLORIDE 0.9 % IV BOLUS
1000.0000 mL | Freq: Once | INTRAVENOUS | Status: AC
  Administered 2018-01-08: 1000 mL via INTRAVENOUS

## 2018-01-08 MED ORDER — PROMETHAZINE HCL 25 MG PO TABS: 25.0000 mg | ORAL_TABLET | Freq: Four times a day (QID) | ORAL | 0 refills | Status: DC | PRN

## 2018-01-08 MED ORDER — ONDANSETRON HCL 4 MG/2ML IJ SOLN
4.0000 mg | Freq: Once | INTRAMUSCULAR | Status: AC
  Administered 2018-01-08: 4 mg via INTRAVENOUS
  Filled 2018-01-08: qty 2

## 2018-01-08 MED ORDER — IOHEXOL 300 MG/ML  SOLN
100.0000 mL | Freq: Once | INTRAMUSCULAR | Status: AC
  Administered 2018-01-08: 80 mL via INTRAVENOUS

## 2018-01-08 MED ORDER — HYDROMORPHONE HCL 1 MG/ML IJ SOLN
1.0000 mg | Freq: Once | INTRAMUSCULAR | Status: AC
  Administered 2018-01-08: 1 mg via INTRAVENOUS
  Filled 2018-01-08: qty 1

## 2018-01-08 MED ORDER — SUCRALFATE 1 G PO TABS: 1.0000 g | ORAL_TABLET | Freq: Three times a day (TID) | ORAL | 0 refills | Status: DC

## 2018-01-08 MED ORDER — FAMOTIDINE IN NACL 20-0.9 MG/50ML-% IV SOLN
20.0000 mg | Freq: Two times a day (BID) | INTRAVENOUS | Status: DC
  Administered 2018-01-08: 20 mg via INTRAVENOUS
  Filled 2018-01-08: qty 50

## 2018-01-08 NOTE — ED Notes (Signed)
Pt presents with mid abd pain x4 hours associated with some pink colored vomiting x1.

## 2018-01-08 NOTE — Discharge Instructions (Signed)
Please fill the prescriptions and take the medications as prescribed.  Schedule follow-up with [facility] for repeat evaluation of your vomiting of blood.  If you have any worsening symptoms, return to the ER immediately.

## 2018-01-08 NOTE — ED Notes (Signed)
Patient transported to CT 

## 2018-01-08 NOTE — ED Triage Notes (Signed)
Pt reports generalized abdominal pain for the last four hours.  Reports "i'm throwing up blood."

## 2018-01-08 NOTE — ED Provider Notes (Addendum)
[facility] EMERGENCY DEPARTMENT Provider Note   CSN: [id number] Arrival date & time: 01/08/18  0500     History   Chief Complaint Chief Complaint  Patient presents with  . Abdominal Pain  . Hematemesis    HPI John Leblanc is a 32 y.o. male.  Patient presents to the emergency department for evaluation of abdominal pain.  Patient reports severe diffuse abdominal pain that started 4 hours before presentation.  He reports that he has thrown up 5 times, has seen blood in his vomit.  He has not had any rectal bleeding or melanotic stools.     History reviewed. No pertinent past medical history.  Patient Active Problem List   Diagnosis Date Noted  . Alcohol abuse with alcohol-induced mood disorder (HCC) 02/02/2017    History reviewed. No pertinent surgical history.      Home Medications    Prior to Admission medications   Medication Sig Start Date End Date Taking? Authorizing Provider  sertraline (ZOLOFT) 100 MG tablet Take 100 mg by mouth daily.   Yes [provider]  traZODone (DESYREL) 100 MG tablet Take 100 mg by mouth at bedtime.   Yes [provider]  gabapentin (NEURONTIN) 100 MG capsule Take 2 capsules (200 mg total) by mouth 2 (two) times daily. Patient not taking: Reported on 01/08/2018 02/02/17   Charm Rings, NP  ibuprofen (ADVIL,MOTRIN) 800 MG tablet Take 1 tablet (800 mg total) by mouth 3 (three) times daily. Patient not taking: Reported on 01/08/2018 01/14/17   Gilda Crease, MD    Family History No family history on file.  Social History Social History   Tobacco Use  . Smoking status: Current Every Day Smoker    Packs/day: 0.50  . Smokeless tobacco: Current User  Substance Use Topics  . Alcohol use: Yes  . Drug use: Yes    Types: Marijuana     Allergies   Bee venom   Review of Systems Review of Systems  Gastrointestinal: Positive for abdominal pain, nausea and vomiting.  All other  systems reviewed and are negative.    Physical Exam Updated Vital Signs BP (!) 101/54   Pulse 79   Temp 98 F (36.7 C) (Oral)   Resp 18   Ht 5\' 8"  (1.727 m)   Wt 81.6 kg   SpO2 96%   BMI 27.37 kg/m   Physical Exam  Constitutional: He is oriented to person, place, and time. He appears well-developed and well-nourished. No distress.  HENT:  Head: Normocephalic and atraumatic.  Right Ear: Hearing normal.  Left Ear: Hearing normal.  Nose: Nose normal.  Mouth/Throat: Oropharynx is clear and moist and mucous membranes are normal.  Eyes: Pupils are equal, round, and reactive to light. Conjunctivae and EOM are normal.  Neck: Normal range of motion. Neck supple.  Cardiovascular: Regular rhythm, S1 normal and S2 normal. Exam reveals no gallop and no friction rub.  No murmur heard. Pulmonary/Chest: Effort normal and breath sounds normal. No respiratory distress. He exhibits no tenderness.  Abdominal: Soft. Normal appearance and bowel sounds are normal. There is no hepatosplenomegaly. There is generalized tenderness. There is no rebound, no guarding, no tenderness at McBurney's point and negative Murphy's sign. No hernia.  Musculoskeletal: Normal range of motion.  Neurological: He is alert and oriented to person, place, and time. He has normal strength. No cranial nerve deficit or sensory deficit. Coordination normal. GCS eye subscore is 4. GCS verbal subscore is 5. GCS motor subscore  is 6.  Skin: Skin is warm, dry and intact. No rash noted. No cyanosis.  Psychiatric: He has a normal mood and affect. His speech is normal and behavior is normal. Thought content normal.  Nursing note and vitals reviewed.    ED Treatments / Results  Labs (all labs ordered are listed, but only abnormal results are displayed) Labs Reviewed  LIPASE, BLOOD - Abnormal; Notable for the following components:      Result Value   Lipase 53 (*)    All other components within normal limits  COMPREHENSIVE  METABOLIC PANEL - Abnormal; Notable for the following components:   Glucose, Bld 107 (*)    Calcium 8.2 (*)    All other components within normal limits  CBC - Abnormal; Notable for the following components:   Hemoglobin 12.5 (*)    HCT 38.4 (*)    All other components within normal limits  URINALYSIS, ROUTINE W REFLEX MICROSCOPIC  PROTIME-INR    EKG None  Radiology Ct Abdomen Pelvis W Contrast  Result Date: 01/08/2018 CLINICAL DATA:  Generalized abdominal pain. History of sickle cell disease EXAM: CT ABDOMEN AND PELVIS WITH CONTRAST TECHNIQUE: Multidetector CT imaging of the abdomen and pelvis was performed using the standard protocol following bolus administration of intravenous contrast. CONTRAST:  80mL OMNIPAQUE IOHEXOL 300 MG/ML  SOLN COMPARISON:  None. FINDINGS: Lower chest: There is bibasilar atelectatic change. Hepatobiliary: No focal liver lesions are evident. Gallbladder wall is not appreciably thickened. No gallstones are appreciated by CT. No biliary duct dilatation. Pancreas: No pancreatic mass or inflammatory focus. Spleen: Spleen appears unremarkable. No abnormal attenuation to suggest infarction. Adrenals/Urinary Tract: Adrenals bilaterally appear unremarkable. Kidneys bilaterally show no evident mass or hydronephrosis on either side. There is no evident renal or ureteral calculus on either side. Urinary bladder is midline with wall thickness within normal limits. Stomach/Bowel: There is fairly diffuse stool throughout the colon. There is no appreciable bowel wall or mesenteric thickening. No evident bowel obstruction. No free air or portal venous air. Vascular/Lymphatic: No abdominal aortic aneurysm. No vascular lesions are appreciable. There is no evident adenopathy in the abdomen or pelvis. Reproductive: Prostate and seminal vesicles are normal in size and contour. No evident pelvic mass. Other: Appendix appears unremarkable. There is no abscess or ascites in the abdomen or  pelvis. Musculoskeletal: There are no blastic or lytic bone lesions. Defects along the inferior aspect of the L3 and L4 vertebral bodies may represent prior endplate infarcts. There is no intramuscular or abdominal wall lesions. IMPRESSION: 1. Fairly diffuse stool throughout colon. No bowel obstruction. No abscess in the abdomen or pelvis. Appendix appears normal. 2.  No evident renal or ureteral calculus.  No hydronephrosis. Comment: A cause for patient's symptoms has not been established with this study. Electronically Signed   By: Bretta Bang III M.D.   On: 01/08/2018 07:44    Procedures Procedures (including critical care time)  Medications Ordered in ED Medications  famotidine (PEPCID) IVPB 20 mg premix (0 mg Intravenous Stopped 01/08/18 0706)  sodium chloride 0.9 % bolus 1,000 mL (0 mLs Intravenous Stopped 01/08/18 0744)  HYDROmorphone (DILAUDID) injection 1 mg (1 mg Intravenous Given 01/08/18 0624)  ondansetron (ZOFRAN) injection 4 mg (4 mg Intravenous Given 01/08/18 0624)  iohexol (OMNIPAQUE) 300 MG/ML solution 100 mL (80 mLs Intravenous Contrast Given 01/08/18 0709)     Initial Impression / Assessment and Plan / ED Course  I have reviewed the triage vital signs and the nursing notes.  Pertinent labs &  imaging results that were available during my care of the patient were reviewed by me and considered in my medical decision making (see chart for details).     Presents to the emerge department complaints of abdominal pain with nausea and vomiting.  He reports that he has seen blood in his vomit.  No melena or rectal bleeding.  Patient does not have any history of liver disease.  No history of peptic ulcer disease or previous GI bleed.  Lab work is unremarkable.  No significant anemia.  Vital signs are unremarkable.  CT abdomen and pelvis does not show any acute pathology other than constipation.  Patient will be appropriate for discharge with PPI, Carafate, follow-up with  gastroenterology.  Return if his symptoms worsen.  Final Clinical Impressions(s) / ED Diagnoses   Final diagnoses:  Generalized abdominal pain  Non-intractable vomiting with nausea, unspecified vomiting type    ED Discharge Orders    None       Gilda Crease, MD 01/08/18 1610    Gilda Crease, MD 01/28/18 [phone]

## 2020-03-02 ENCOUNTER — Encounter (HOSPITAL_COMMUNITY): Payer: Self-pay | Admitting: Psychiatry

## 2020-03-02 ENCOUNTER — Inpatient Hospital Stay (HOSPITAL_COMMUNITY)
Admission: AD | Admit: 2020-03-02 | Discharge: 2020-03-06 | DRG: 897 | Disposition: A | Discharge status: Home / Self Care | Payer: Federal, State, Local not specified - Other | Attending: Psychiatry | Admitting: Psychiatry

## 2020-03-02 ENCOUNTER — Encounter (HOSPITAL_COMMUNITY): Payer: Self-pay

## 2020-03-02 ENCOUNTER — Emergency Department (HOSPITAL_COMMUNITY)
Admission: EM | Admit: 2020-03-02 | Discharge: 2020-03-02 | Disposition: A | Discharge status: Home / Self Care | Payer: Self-pay | Attending: Emergency Medicine | Admitting: Emergency Medicine

## 2020-03-02 ENCOUNTER — Other Ambulatory Visit: Payer: Self-pay

## 2020-03-02 DIAGNOSIS — F19959 Other psychoactive substance use, unspecified with psychoactive substance-induced psychotic disorder, unspecified: Principal | ICD-10-CM | POA: Diagnosis present

## 2020-03-02 DIAGNOSIS — F1014 Alcohol abuse with alcohol-induced mood disorder: Secondary | ICD-10-CM | POA: Diagnosis present

## 2020-03-02 DIAGNOSIS — Z91030 Bee allergy status: Secondary | ICD-10-CM | POA: Diagnosis not present

## 2020-03-02 DIAGNOSIS — F250 Schizoaffective disorder, bipolar type: Secondary | ICD-10-CM | POA: Diagnosis not present

## 2020-03-02 DIAGNOSIS — F319 Bipolar disorder, unspecified: Secondary | ICD-10-CM | POA: Diagnosis present

## 2020-03-02 DIAGNOSIS — F1994 Other psychoactive substance use, unspecified with psychoactive substance-induced mood disorder: Secondary | ICD-10-CM

## 2020-03-02 DIAGNOSIS — Y906 Blood alcohol level of 120-199 mg/100 ml: Secondary | ICD-10-CM | POA: Diagnosis present

## 2020-03-02 DIAGNOSIS — F4310 Post-traumatic stress disorder, unspecified: Secondary | ICD-10-CM | POA: Diagnosis present

## 2020-03-02 DIAGNOSIS — R45851 Suicidal ideations: Secondary | ICD-10-CM | POA: Diagnosis present

## 2020-03-02 DIAGNOSIS — F1410 Cocaine abuse, uncomplicated: Secondary | ICD-10-CM

## 2020-03-02 DIAGNOSIS — F1210 Cannabis abuse, uncomplicated: Secondary | ICD-10-CM

## 2020-03-02 DIAGNOSIS — Z20822 Contact with and (suspected) exposure to covid-19: Secondary | ICD-10-CM | POA: Insufficient documentation

## 2020-03-02 DIAGNOSIS — F259 Schizoaffective disorder, unspecified: Secondary | ICD-10-CM | POA: Diagnosis present

## 2020-03-02 DIAGNOSIS — I10 Essential (primary) hypertension: Secondary | ICD-10-CM | POA: Diagnosis present

## 2020-03-02 DIAGNOSIS — F1990 Other psychoactive substance use, unspecified, uncomplicated: Secondary | ICD-10-CM

## 2020-03-02 DIAGNOSIS — F17210 Nicotine dependence, cigarettes, uncomplicated: Secondary | ICD-10-CM | POA: Diagnosis present

## 2020-03-02 DIAGNOSIS — F17200 Nicotine dependence, unspecified, uncomplicated: Secondary | ICD-10-CM | POA: Insufficient documentation

## 2020-03-02 HISTORY — DX: Schizoaffective disorder, unspecified: F25.9

## 2020-03-02 HISTORY — DX: Bipolar disorder, unspecified: F31.9

## 2020-03-02 HISTORY — DX: Post-traumatic stress disorder, unspecified: F43.10

## 2020-03-02 LAB — CBC WITH DIFFERENTIAL/PLATELET
Abs Immature Granulocytes: 0.03 10*3/uL (ref 0.00–0.07)
Basophils Absolute: 0.1 10*3/uL (ref 0.0–0.1)
Basophils Relative: 1 %
Eosinophils Absolute: 0 10*3/uL (ref 0.0–0.5)
Eosinophils Relative: 0 %
HCT: 44.5 % (ref 39.0–52.0)
Hemoglobin: 14.3 g/dL (ref 13.0–17.0)
Immature Granulocytes: 0 %
Lymphocytes Relative: 23 %
Lymphs Abs: 2.4 10*3/uL (ref 0.7–4.0)
MCH: 28.2 pg (ref 26.0–34.0)
MCHC: 32.1 g/dL (ref 30.0–36.0)
MCV: 87.8 fL (ref 80.0–100.0)
Monocytes Absolute: 0.5 10*3/uL (ref 0.1–1.0)
Monocytes Relative: 5 %
Neutro Abs: 7.4 10*3/uL (ref 1.7–7.7)
Neutrophils Relative %: 71 %
Platelets: 394 10*3/uL (ref 150–400)
RBC: 5.07 MIL/uL (ref 4.22–5.81)
RDW: 14 % (ref 11.5–15.5)
WBC: 10.5 10*3/uL (ref 4.0–10.5)
nRBC: 0 % (ref 0.0–0.2)

## 2020-03-02 LAB — RESP PANEL BY RT-PCR (FLU A&B, COVID) ARPGX2
Influenza A by PCR: NEGATIVE
Influenza B by PCR: NEGATIVE
SARS Coronavirus 2 by RT PCR: NEGATIVE

## 2020-03-02 LAB — COMPREHENSIVE METABOLIC PANEL
ALT: 14 U/L (ref 0–44)
AST: 21 U/L (ref 15–41)
Albumin: 4.7 g/dL (ref 3.5–5.0)
Alkaline Phosphatase: 74 U/L (ref 38–126)
Anion gap: 10 (ref 5–15)
BUN: 7 mg/dL (ref 6–20)
CO2: 28 mmol/L (ref 22–32)
Calcium: 9 mg/dL (ref 8.9–10.3)
Chloride: 106 mmol/L (ref 98–111)
Creatinine, Ser: 0.85 mg/dL (ref 0.61–1.24)
GFR, Estimated: >60 mL/min (ref >60)
Glucose, Bld: 101 mg/dL — ABNORMAL HIGH (ref 70–99)
Potassium: 4.3 mmol/L (ref 3.5–5.1)
Sodium: 144 mmol/L (ref 135–145)
Total Bilirubin: 0.4 mg/dL (ref 0.3–1.2)
Total Protein: 8.1 g/dL (ref 6.5–8.1)

## 2020-03-02 LAB — RAPID URINE DRUG SCREEN, HOSP PERFORMED
Amphetamines: NOT DETECTED
Barbiturates: NOT DETECTED
Benzodiazepines: NOT DETECTED
Cocaine: POSITIVE — AB
Opiates: NOT DETECTED
Tetrahydrocannabinol: POSITIVE — AB

## 2020-03-02 LAB — SALICYLATE LEVEL: Salicylate Lvl: <7 mg/dL — ABNORMAL LOW (ref 7.0–30.0)

## 2020-03-02 LAB — ACETAMINOPHEN LEVEL: Acetaminophen (Tylenol), Serum: <10 ug/mL — ABNORMAL LOW (ref 10–30)

## 2020-03-02 LAB — ETHANOL: Alcohol, Ethyl (B): 189 mg/dL — ABNORMAL HIGH (ref <10)

## 2020-03-02 MED ORDER — IBUPROFEN 800 MG PO TABS
800.0000 mg | ORAL_TABLET | Freq: Once | ORAL | Status: AC
  Administered 2020-03-02: 800 mg via ORAL
  Filled 2020-03-02: qty 1

## 2020-03-02 MED ORDER — HYDROXYZINE HCL 25 MG PO TABS
25.0000 mg | ORAL_TABLET | Freq: Three times a day (TID) | ORAL | Status: DC | PRN
  Administered 2020-03-03 – 2020-03-05 (×3): 25 mg via ORAL
  Filled 2020-03-02 (×4): qty 1
  Filled 2020-03-02: qty 10

## 2020-03-02 MED ORDER — DIPHENHYDRAMINE HCL 25 MG PO CAPS
50.0000 mg | ORAL_CAPSULE | Freq: Once | ORAL | Status: AC | PRN
  Administered 2020-03-02: 50 mg via ORAL
  Filled 2020-03-02: qty 2

## 2020-03-02 MED ORDER — MAGNESIUM HYDROXIDE 400 MG/5ML PO SUSP: 30.0000 mL | Freq: Every day | ORAL | Status: DC | PRN

## 2020-03-02 MED ORDER — ACETAMINOPHEN 325 MG PO TABS
650.0000 mg | ORAL_TABLET | Freq: Four times a day (QID) | ORAL | Status: DC | PRN
  Administered 2020-03-02 – 2020-03-04 (×4): 650 mg via ORAL
  Filled 2020-03-02 (×4): qty 2

## 2020-03-02 MED ORDER — OLANZAPINE 2.5 MG PO TABS
2.5000 mg | ORAL_TABLET | Freq: Two times a day (BID) | ORAL | Status: DC
  Administered 2020-03-02 – 2020-03-03 (×2): 2.5 mg via ORAL
  Filled 2020-03-02 (×4): qty 1

## 2020-03-02 MED ORDER — ALUM & MAG HYDROXIDE-SIMETH 200-200-20 MG/5ML PO SUSP: 30.0000 mL | Freq: Four times a day (QID) | ORAL | Status: DC | PRN

## 2020-03-02 MED ORDER — NICOTINE 21 MG/24HR TD PT24
21.0000 mg | MEDICATED_PATCH | Freq: Every day | TRANSDERMAL | Status: DC
  Administered 2020-03-02 – 2020-03-06 (×5): 21 mg via TRANSDERMAL
  Filled 2020-03-02 (×12): qty 1

## 2020-03-02 MED ORDER — TRAZODONE HCL 50 MG PO TABS
50.0000 mg | ORAL_TABLET | Freq: Every evening | ORAL | Status: DC | PRN
  Administered 2020-03-03 – 2020-03-05 (×3): 50 mg via ORAL
  Filled 2020-03-02 (×4): qty 1

## 2020-03-02 MED ORDER — ALUM & MAG HYDROXIDE-SIMETH 200-200-20 MG/5ML PO SUSP: 30.0000 mL | ORAL | Status: DC | PRN

## 2020-03-02 MED ORDER — ACETAMINOPHEN 325 MG PO TABS
650.0000 mg | ORAL_TABLET | ORAL | Status: DC | PRN
  Administered 2020-03-02: 650 mg via ORAL
  Filled 2020-03-02: qty 2

## 2020-03-02 MED ORDER — ONDANSETRON HCL 4 MG PO TABS: 4.0000 mg | ORAL_TABLET | Freq: Three times a day (TID) | ORAL | Status: DC | PRN

## 2020-03-02 MED ORDER — LORAZEPAM 1 MG PO TABS
1.0000 mg | ORAL_TABLET | Freq: Once | ORAL | Status: AC
  Administered 2020-03-02: 1 mg via ORAL
  Filled 2020-03-02: qty 1

## 2020-03-02 MED ORDER — NICOTINE 21 MG/24HR TD PT24
21.0000 mg | MEDICATED_PATCH | Freq: Once | TRANSDERMAL | Status: DC
  Administered 2020-03-02: 21 mg via TRANSDERMAL
  Filled 2020-03-02: qty 1

## 2020-03-02 NOTE — ED Notes (Signed)
[facility] here for transport

## 2020-03-02 NOTE — ED Notes (Signed)
Call from [facility] pt accepted to [phone]  But bed is not available until after 0900 this morning
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
## 2020-03-02 NOTE — [facility] Group Notes (Signed)
[facility] LCSW Group Therapy  03/02/2020 2:25 PM  Type of Therapy:  Group Therapy  Participation Level:  Did Not Attend  Summary of Progress/Problems:Group was focused on Psychoeducation on CBT therapy, specifically, cognitive distortions. Pts were given handouts about cognitive distortions, behavioral activation plans, and a list of activities.  Pt did not attend or take handout.  Felizardo Hoffmann 03/02/2020, 2:25 PM

## 2020-03-02 NOTE — Tx Team (Signed)
Initial Treatment Plan 03/02/2020 6:54 PM PERRIN GENS [id number]    PATIENT STRESSORS: Medication change or noncompliance   PATIENT STRENGTHS: General fund of knowledge   PATIENT IDENTIFIED PROBLEMS: I have been feeling suicidal                      DISCHARGE CRITERIA:  Improved stabilization in mood, thinking, and/or behavior  PRELIMINARY DISCHARGE PLAN: Return to previous living arrangement  PATIENT/FAMILY INVOLVEMENT: This treatment plan has been presented to and reviewed with the patient, John Leblanc, and/or family member,   The patient and family have been given the opportunity to ask questions and make suggestions.  Jerrye Bushy, RN 03/02/2020, 6:54 PM

## 2020-03-02 NOTE — ED Provider Notes (Signed)
Emergency Department Provider Note   I have reviewed the triage vital signs and the nursing notes.   HISTORY  Chief Complaint Psychiatric Evaluation ([employer] brought pt. from home after family reported that he had a knife to his neck and states that he was going to cut his neck )   HPI John Leblanc is a 34 y.o. male with PMH reviewed below presents to the emergency department with police after family called stating that the patient was expressing suicidal thoughts and holding a knife to his neck.  Police tell me that they arrived on scene to find the patient holding a knife near his waistband and noticed a superficial, linear abrasion to his right neck.  Patient denied active suicidal ideation but given the situation he was transported to the emergency department for further evaluation.   Patient tells me that "life happened" earlier and that he was going through some things and confirms that he was feeling suicidal earlier but states he is no longer feeling that way.  He states he developed a plan to jump off a bridge but that the scrape to the right neck was actually done with the fingernail and not a knife.  He tells me that if he wanted to kill himself he would have done it earlier and the police would have arrived to find him dead.  He reports drinking alcohol much earlier in the evening and denies drug use.  He denies any homicidal ideation.  Past Medical History:  Diagnosis Date  . Bipolar affective (HCC)   . PTSD (post-traumatic stress disorder)   . Schizoaffective disorder [facility])     Patient Active Problem List   Diagnosis Date Noted  . Alcohol abuse with alcohol-induced mood disorder (HCC) 02/02/2017    History reviewed. No pertinent surgical history.  Allergies Bee venom  History reviewed. No pertinent family history.  Social History Social History   Tobacco Use  . Smoking status: Current Every Day Smoker    Packs/day: 0.50  . Smokeless tobacco: Current User    Substance Use Topics  . Alcohol use: Yes  . Drug use: Yes    Types: Marijuana    Review of Systems  Constitutional: No fever/chills Eyes: No visual changes. ENT: No sore throat. Cardiovascular: Denies chest pain. Respiratory: Denies shortness of breath. Gastrointestinal: No abdominal pain.  No nausea, no vomiting.  No diarrhea.  No constipation. Genitourinary: Negative for dysuria. Musculoskeletal: Negative for back pain. Skin: Negative for rash. Linear abrasion to the right neck.  Neurological: Negative for headaches, focal weakness or numbness. Psychiatric: Positive SI earlier. Denies HI/AH/VH.   10-point ROS otherwise negative.  ____________________________________________   PHYSICAL EXAM:  VITAL SIGNS: Vitals:   03/02/20 0239  BP: (!) 150/109  Pulse: 81  Resp: 20  Temp: 98.6 F (37 C)  SpO2: 100%    Constitutional: Alert and oriented. Well appearing and in no acute distress. Eyes: Conjunctivae are normal.  Head: Atraumatic. Nose: No congestion/rhinnorhea. Mouth/Throat: Mucous membranes are moist. Neck: No stridor.   Cardiovascular: Good peripheral circulation.   Respiratory: Normal respiratory effort.  Gastrointestinal: No distention.  Musculoskeletal: No gross deformities of extremities. Neurologic:  Normal speech and language.  Skin:  Skin is warm, dry and intact. No rash noted. Psychiatric: Patient denies active suicidal ideation or homicidal ideation.  He has mild agitation am not openly compliant with staff in the emergency department but redirectable. Not responding to internal stimuli.  ____________________________________________   LABS (all labs ordered are listed, but  only abnormal results are displayed)  Labs Reviewed  COMPREHENSIVE METABOLIC PANEL - Abnormal; Notable for the following components:      Result Value   Glucose, Bld 101 (*)    All other components within normal limits  ACETAMINOPHEN LEVEL - Abnormal; Notable for the following  components:   Acetaminophen (Tylenol), Serum <10 (*)    All other components within normal limits  ETHANOL - Abnormal; Notable for the following components:   Alcohol, Ethyl (B) 189 (*)    All other components within normal limits  SALICYLATE LEVEL - Abnormal; Notable for the following components:   Salicylate Lvl <7.0 (*)    All other components within normal limits  RESP PANEL BY RT-PCR (FLU A&B, COVID) ARPGX2  CBC WITH DIFFERENTIAL/PLATELET  RAPID URINE DRUG SCREEN, HOSP PERFORMED   ____________________________________________   PROCEDURES  Procedure(s) performed:   Procedures  None  ____________________________________________   INITIAL IMPRESSION / ASSESSMENT AND PLAN / ED COURSE  Pertinent labs & imaging results that were available during my care of the patient were reviewed by me and considered in my medical decision making (see chart for details).   Patient is mildly agitated here.  I was able to establish some rapport initially and explained the process along with my concerns.  I did complete IVC paperwork.  Patient was initially refusing blood work and to get dressed out but after discussion he is consenting to these things.  Denying any active suicidal ideation but his story immediately prior to ED presentation is very concerning.  Will provide medical clearance and have TTS evaluate.  04:00 AM  IVC paperwork filed. Labs reviewed. Patient is medically clear for TTS evaluation. COVID negative. Keep the neck wound clean and dry. No laceration requiring repair.   ____________________________________________  FINAL CLINICAL IMPRESSION(S) / ED DIAGNOSES  Final diagnoses:  Suicidal ideation     MEDICATIONS GIVEN DURING THIS VISIT:  Medications  nicotine (NICODERM CQ - dosed in mg/24 hours) patch 21 mg (21 mg Transdermal Patch Applied 03/02/20 0308)  acetaminophen (TYLENOL) tablet 650 mg (has no administration in time range)  ondansetron (ZOFRAN) tablet 4 mg (has  no administration in time range)  alum & mag hydroxide-simeth (MAALOX/MYLANTA) 200-200-20 MG/5ML suspension 30 mL (has no administration in time range)  ibuprofen (ADVIL) tablet 800 mg (800 mg Oral Given 03/02/20 0309)    Note:  This document was prepared using Dragon voice recognition software and may include unintentional dictation errors.  Alona Bene, MD, [facility]    Pellegrino Kennard, Arlyss Repress, MD 03/02/20 [phone]

## 2020-03-02 NOTE — Progress Notes (Signed)
   03/02/20 2300  Psych Admission Type (Psych Patients Only)  Admission Status Involuntary  Psychosocial Assessment  Patient Complaints None  Eye Contact Brief  Facial Expression Other (Comment) (sleepy spoke with patient upon awakening)  Affect Irritable  Speech Unremarkable  Interaction Minimal  Motor Activity Other (Comment) (WDL )  Appearance/Hygiene In scrubs  Behavior Characteristics Irritable  Mood Irritable  Thought Process  Coherency WDL  Content WDL  Delusions None reported or observed  Perception WDL  Hallucination UTA  Judgment UTA  Confusion None  Danger to Self  Current suicidal ideation? Passive  Self-Injurious Behavior No self-injurious ideation or behavior indicators observed or expressed   Agreement Not to Harm Self Yes  Description of Agreement Verbal agreement  Danger to Others  Danger to Others None reported or observed  D: Patient endorses passive SI at this time. Patient denies HI/AH/VH at this time. Patient contracts for safety.  A: Provided positive reinforcement and encouragement.  R: Patient cooperative and receptive to efforts. Patient remains safe on unit.
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
## 2020-03-02 NOTE — ED Notes (Addendum)
Pt on with TTS.  
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
## 2020-03-02 NOTE — BH Assessment (Addendum)
BHH Assessment Progress Note  Per Nira Conn, NP, this pt requires psychiatric hospitalization.  Selena Batten, RN, [facility] has assigned pt to [facility] Rm 303-2; [facility] will be ready to receive pt after 09:00.  Pt presents under IVC initiated by EDP Alona Bene, MD and IVC documents have been faxed to [facility].  EDP Lynden Oxford, MD and pt's nurse, Waldo Laine, have been notified, and Waldo Laine agrees to call report to [phone].  Pt is to be transported via Patent examiner.   Doylene Canning, [place] Behavioral Health Coordinator [phone]

## 2020-03-02 NOTE — ED Provider Notes (Signed)
9:32 AM Was just informed by behavioral health that patient has been accepted and to be admitted at [facility] under the care of Dr. Tamera Punt.  Reassessed patient and he was resting comfortably in no acute distress.  Will get patient ready for [facility] admission.     Emeree Mahler, Canary Brim, MD 03/02/20 [phone]
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
## 2020-03-03 DIAGNOSIS — F1994 Other psychoactive substance use, unspecified with psychoactive substance-induced mood disorder: Secondary | ICD-10-CM

## 2020-03-03 DIAGNOSIS — F1410 Cocaine abuse, uncomplicated: Secondary | ICD-10-CM

## 2020-03-03 DIAGNOSIS — F1210 Cannabis abuse, uncomplicated: Secondary | ICD-10-CM

## 2020-03-03 DIAGNOSIS — F1990 Other psychoactive substance use, unspecified, uncomplicated: Secondary | ICD-10-CM

## 2020-03-03 LAB — LIPID PANEL
Cholesterol: 159 mg/dL (ref 0–200)
HDL: 56 mg/dL (ref >40)
LDL Cholesterol: 85 mg/dL (ref 0–99)
Total CHOL/HDL Ratio: 2.8 RATIO
Triglycerides: 88 mg/dL (ref <150)
VLDL: 18 mg/dL (ref 0–40)

## 2020-03-03 LAB — HEMOGLOBIN A1C
Hgb A1c MFr Bld: 5.2 % (ref 4.8–5.6)
Mean Plasma Glucose: 102.54 mg/dL

## 2020-03-03 LAB — TSH: TSH: 0.697 u[IU]/mL (ref 0.350–4.500)

## 2020-03-03 MED ORDER — OLANZAPINE 5 MG PO TABS
5.0000 mg | ORAL_TABLET | Freq: Every day | ORAL | Status: DC
  Administered 2020-03-03 – 2020-03-05 (×4): 5 mg via ORAL
  Filled 2020-03-03 (×2): qty 1
  Filled 2020-03-03: qty 2
  Filled 2020-03-03: qty 7
  Filled 2020-03-03 (×2): qty 1

## 2020-03-03 MED ORDER — CLONIDINE HCL 0.1 MG PO TABS
0.1000 mg | ORAL_TABLET | Freq: Two times a day (BID) | ORAL | Status: DC
  Administered 2020-03-03 – 2020-03-06 (×6): 0.1 mg via ORAL
  Filled 2020-03-03: qty 1
  Filled 2020-03-03: qty 14
  Filled 2020-03-03 (×6): qty 1
  Filled 2020-03-03: qty 14
  Filled 2020-03-03: qty 1

## 2020-03-03 NOTE — Plan of Care (Signed)
Nurse discussed anxiety, depression and coping skills with patient.  
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
## 2020-03-03 NOTE — Progress Notes (Signed)
Adult Psychoeducational Group Note  Date:  03/03/2020 Time:  7:31 AM  Group Topic/Focus:  Wrap-Up Group:   The focus of this group is to help patients review their daily goal of treatment and discuss progress on daily workbooks.  Participation Level:  Active  Participation Quality:  Appropriate  Affect:  Appropriate  Cognitive:  Appropriate  Insight: Appropriate  Engagement in Group:  Engaged  Modes of Intervention:  Discussion  Additional Comments:  Pt attend wrap up group. His day 8. He talk to doctor great conversation and helpful to him. Staff help greatly expectly Tech AJ.  Charna Busman Long 03/03/2020, 7:31 AM

## 2020-03-03 NOTE — Progress Notes (Signed)
D:  Patient's self inventory sheet, patient has fair sleep, sleep medication helpful.  Fair appetite, normal energy level, good concentration.  Rated depression and hopeless 2.  Denied withdrawals.  Denied SI.  Physical problems, worst pain in past 24 hours is #8.  Does have pain medicine.  Goal is to work on me.  Does have discharge plans. A:  Medications administered per MD orders.  Emotional support and encouragement given patient. R:  Denied SI and HI, contracts for safety.  Denied A/V hallucinations.  Safety maintained with 15 minute checks.

## 2020-03-03 NOTE — Progress Notes (Signed)
BHH Group Notes:  (Nursing/MHT/Case Management/Adjunct)  Date:  03/03/2020  Time:  2015  Type of Therapy:  wrap up group  Participation Level:  Active  Participation Quality:  Appropriate, Attentive, Sharing and Supportive  Affect:  Depressed  Cognitive:  Appropriate  Insight:  Improving  Engagement in Group:  Engaged  Modes of Intervention:  Clarification, Education and Support  Summary of Progress/Problems: Positive thinking and positive change were discussed.   Marcille Buffy 03/03/2020, 8:57 PM

## 2020-03-03 NOTE — Progress Notes (Addendum)
[facility] MD Progress Note  03/03/2020 10:00 AM John Leblanc  MRN:  [id number] Subjective:  Patient is compliant with his current medications. Patient reports that he believes he supposed to be on an antihypertensive however patient does not know the name and cannot find evidence of on in the EMR; however patient does have hypertension on exam.  Patient reports that he "slept a little" last night and that his appetite "comes and goes." Patient reports that his mood is "alright" and that he "just wants to see his kids." Patient is not endorsing any hallucinations at this time and that last time he saw anything was a few days ago when he thought he saw his deceased daughter who was born still born. Patient contracts for safety.  Principal Problem: Substance induced mood disorder (HCC) Diagnosis: Principal Problem:   Substance induced mood disorder (HCC) Active Problems:   Schizoaffective disorder (HCC)   Substance use disorder   Cocaine abuse (HCC)   Marijuana abuse  Total Time spent with patient: 15 minutes  Past Psychiatric History: See H&P  Past Medical History:  Past Medical History:  Diagnosis Date  . Bipolar affective (HCC)   . PTSD (post-traumatic stress disorder)   . Schizoaffective disorder (HCC)    History reviewed. No pertinent surgical history. Family History: History reviewed. No pertinent family history. Family Psychiatric  History: See H&P Social History:  Social History   Substance and Sexual Activity  Alcohol Use Yes     Social History   Substance and Sexual Activity  Drug Use Yes  . Types: Marijuana    Social History   Socioeconomic History  . Marital status: Single    Spouse name: Not on file  . Number of children: Not on file  . Years of education: Not on file  . Highest education level: Not on file  Occupational History  . Not on file  Tobacco Use  . Smoking status: Current Every Day Smoker    Packs/day: 0.50  . Smokeless tobacco: Current User   Substance and Sexual Activity  . Alcohol use: Yes  . Drug use: Yes    Types: Marijuana  . Sexual activity: Not on file  Other Topics Concern  . Not on file  Social History Narrative   ** Merged History Encounter **       Social Determinants of Health   Financial Resource Strain:   . Difficulty of Paying Living Expenses: Not on file  Food Insecurity:   . Worried About Programme researcher, broadcasting/film/video in the Last Year: Not on file  . Ran Out of Food in the Last Year: Not on file  Transportation Needs:   . Lack of Transportation (Medical): Not on file  . Lack of Transportation (Non-Medical): Not on file  Physical Activity:   . Days of Exercise per Week: Not on file  . Minutes of Exercise per Session: Not on file  Stress:   . Feeling of Stress : Not on file  Social Connections:   . Frequency of Communication with Friends and Family: Not on file  . Frequency of Social Gatherings with Friends and Family: Not on file  . Attends Religious Services: Not on file  . Active Member of Clubs or Organizations: Not on file  . Attends Banker Meetings: Not on file  . Marital Status: Not on file   Additional Social History:  Sleep: Poor  Appetite:  Poor  Current Medications: Current Facility-Administered Medications  Medication Dose Route Frequency Provider Last Rate Last Admin  . acetaminophen (TYLENOL) tablet 650 mg  650 mg Oral Q6H PRN Jackelyn Poling, NP   650 mg at 03/02/20 1835  . alum & mag hydroxide-simeth (MAALOX/MYLANTA) 200-200-20 MG/5ML suspension 30 mL  30 mL Oral Q4H PRN Nira Conn A, NP      . hydrOXYzine (ATARAX/VISTARIL) tablet 25 mg  25 mg Oral TID PRN Nira Conn A, NP      . magnesium hydroxide (MILK OF MAGNESIA) suspension 30 mL  30 mL Oral Daily PRN Nira Conn A, NP      . nicotine (NICODERM CQ - dosed in mg/24 hours) patch 21 mg  21 mg Transdermal Daily Antonieta Pert, MD   21 mg at 03/03/20 0858  . OLANZapine (ZYPREXA)  tablet 2.5 mg  2.5 mg Oral BID Cristofano, Worthy Rancher, MD   2.5 mg at 03/03/20 0858  . traZODone (DESYREL) tablet 50 mg  50 mg Oral QHS PRN Jackelyn Poling, NP        Lab Results:  Results for orders placed or performed during the hospital encounter of 03/02/20 (from the past 48 hour(s))  Hemoglobin A1c     Status: None   Collection Time: 03/03/20  6:32 AM  Result Value Ref Range   Hgb A1c MFr Bld 5.2 4.8 - 5.6 %    Comment: (NOTE) Pre diabetes:          5.7%-6.4%  Diabetes:              >6.4%  Glycemic control for   <7.0% adults with diabetes    Mean Plasma Glucose 102.54 mg/dL    Comment: Performed at [facility], [address], [place], [place] [address]  Lipid panel     Status: None   Collection Time: 03/03/20  6:32 AM  Result Value Ref Range   Cholesterol 159 0 - 200 mg/dL   Triglycerides 88 <258 mg/dL   HDL 56 >52 mg/dL   Total CHOL/HDL Ratio 2.8 RATIO   VLDL 18 0 - 40 mg/dL   LDL Cholesterol 85 0 - 99 mg/dL    Comment:        Total Cholesterol/HDL:CHD Risk Coronary Heart Disease Risk Table                     Men   Women  1/2 Average Risk   3.4   3.3  Average Risk       5.0   4.4  2 X Average Risk   9.6   7.1  3 X Average Risk  23.4   11.0        Use the calculated Patient Ratio above and the CHD Risk Table to determine the patient's CHD Risk.        ATP III CLASSIFICATION (LDL):  <100     mg/dL   Optimal  778-242  mg/dL   Near or Above                    Optimal  130-159  mg/dL   Borderline  353-614  mg/dL   High  >431     mg/dL   Very High Performed at [facility], [address] [place], [place] [address]   TSH     Status: None   Collection Time: 03/03/20  6:32 AM  Result Value  Ref Range   TSH 0.697 0.350 - 4.500 uIU/mL    Comment: Performed by a 3rd Generation assay with a functional sensitivity of <=0.01 uIU/mL. Performed at [facility], [address], [place], [place] [address]     Blood Alcohol  level:  Lab Results  Component Value Date   ETH 189 (H) 03/02/2020   ETH 114 (H) 02/01/2017    Metabolic Disorder Labs: Lab Results  Component Value Date   HGBA1C 5.2 03/03/2020   MPG 102.54 03/03/2020   No results found for: PROLACTIN Lab Results  Component Value Date   CHOL 159 03/03/2020   TRIG 88 03/03/2020   HDL 56 03/03/2020   CHOLHDL 2.8 03/03/2020   VLDL 18 03/03/2020   LDLCALC 85 03/03/2020    Physical Findings: AIMS:  , ,  ,  ,    CIWA:    COWS:     Musculoskeletal: Strength & Muscle Tone: Laying in bed but able to turn himself Gait & Station: in bed on exam Patient leans: N/A  Psychiatric Specialty Exam: Physical Exam HENT:     Head: Normocephalic.  Pulmonary:     Effort: Pulmonary effort is normal.  Neurological:     Mental Status: He is alert.     Review of Systems  Cardiovascular: Negative for chest pain.  Gastrointestinal: Negative for abdominal pain.  Neurological: Negative for headaches.  Psychiatric/Behavioral: Negative for hallucinations.    Blood pressure (!) 135/104, pulse (!) 57, temperature 98.3 F (36.8 C), temperature source Oral, resp. rate 18, height 5\' 6"  (1.676 m), weight 68 kg, SpO2 100 %.Body mass index is 24.21 kg/m.  General Appearance: Casual  Eye Contact:  Poor  Speech:  Clear and Coherent  Volume:  Decreased  Mood:  "alright. I just want to see my kids."  Affect:  Depressed  Thought Process:  Coherent  Orientation:  NA  Thought Content:  Logical  Suicidal Thoughts:  No  Homicidal Thoughts:  No  Memory:  Remote;   Good  Judgement:  Fair  Insight:  Fair  Psychomotor Activity:  Decreased  Concentration:  Concentration: Fair  Recall:  NA  Fund of Knowledge:  Fair  Language:  Good  Akathisia:  No  Handed:  Right  AIMS (if indicated):     Assets:  Desire for Improvement  ADL's:  Intact  Cognition:  WNL  Sleep:  Number of Hours: 6.75     Treatment Plan Summary: Daily contact with patient to assess and  evaluate symptoms and progress in treatment Patient is no longer endorsing hallucinations and his UDS resulted Cocaine with MJ suggesting that patient psychosis was triggered by recent substance use. This is further supported by the fact that patient is no longer endorsing signs of psychosis and is only requiring a low dose of antipsychotic at admission. Concern that patient may  have depression as his hallucination was associated with the death of a child and he reports poor appetite and patient seems to have psychomotor retardation and a depressed affect overall.Will adjust patient Zyprexa to 5mg  QHS to help with sleep and mood as well as previously reported psychotic symptoms.  Will also start Clonidine for hypertension.  Scheduled - Nicotine 21mg  patch -Zyprexa 5.0mg  QHS, AH and augment mood - 0.1mg  BID Clonidine, for hypertension unsure if 2/2 to substance use. Will recommend PCP follow-up and readjust antihypertensive regimen is patient continues to have hypertension outside the setting of known substance use  PRN -Tylenol 650mg  q6h -Maalox 55ml q4h -  Atarax 25mg  TID -Milk of Mag 30mL -Trazodone 50mg  QHS Bobbye MortonJai B Skyann Ganim, MD 03/03/2020, 10:00 AM

## 2020-03-03 NOTE — Progress Notes (Signed)
   03/03/20 2102  Psych Admission Type (Psych Patients Only)  Admission Status Involuntary  Psychosocial Assessment  Patient Complaints Depression  Eye Contact Brief  Facial Expression Other (Comment) (Appropriate )  Affect Sad  Speech Unremarkable  Interaction Minimal  Motor Activity Other (Comment) (WDL)  Appearance/Hygiene Unremarkable  Behavior Characteristics Appropriate to situation;Cooperative  Mood Sad  Thought Process  Coherency WDL  Content WDL  Delusions None reported or observed  Perception WDL  Hallucination None reported or observed  Judgment WDL  Confusion None  Danger to Self  Current suicidal ideation? Denies  Self-Injurious Behavior No self-injurious ideation or behavior indicators observed or expressed   Agreement Not to Harm Self Yes  Description of Agreement Verbal agreement   Danger to Others  Danger to Others None reported or observed  D: Patient presents with sad but pleasant affect. Patient denies SI/HI at this time. Patient also denies AH/VH at this time. Patient contracts for safety.  A: Provided positive reinforcement and encouragement.  R: Patient cooperative and receptive to efforts. Patient remains safe on unit.

## 2020-03-04 MED ORDER — SERTRALINE HCL 100 MG PO TABS
100.0000 mg | ORAL_TABLET | Freq: Every day | ORAL | Status: DC
  Administered 2020-03-04 – 2020-03-06 (×3): 100 mg via ORAL
  Filled 2020-03-04 (×3): qty 1
  Filled 2020-03-04: qty 7
  Filled 2020-03-04: qty 1

## 2020-03-04 NOTE — Progress Notes (Signed)
   03/04/20 1000  Psych Admission Type (Psych Patients Only)  Admission Status Involuntary  Psychosocial Assessment  Patient Complaints Depression  Eye Contact Brief  Facial Expression Other (Comment) (Appropriate )  Affect Sad  Speech Unremarkable  Interaction Minimal  Motor Activity Other (Comment) (WDL)  Appearance/Hygiene Unremarkable  Behavior Characteristics Appropriate to situation  Mood Depressed  Thought Process  Coherency WDL  Content WDL  Delusions None reported or observed  Perception WDL  Hallucination None reported or observed  Judgment WDL  Confusion None  Danger to Self  Current suicidal ideation? Denies  Self-Injurious Behavior No self-injurious ideation or behavior indicators observed or expressed   Agreement Not to Harm Self Yes  Description of Agreement Verbal agreement   Danger to Others  Danger to Others None reported or observed  Dar Note: Patient presents with a flat affect and depressed mood.  Denies suicidal thoughts, auditory and visual hallucinations.  Medication given as prescribed.  Routine safety checks maintained.  Minimal interaction with staff and peers.  Did not attend group when invited.  Patient is safe on and off the unit.

## 2020-03-04 NOTE — [facility] Group Notes (Signed)
LCSW Group Therapy Note  03/04/2020   1430  Type of Therapy and Topic:  Group Therapy: What's So Funny About Mental Illness?  Participation Level:  Did Not Attend   Description of Group:   In this group, patients learned how to recognize how they personally define their mental illness. This group also addressed stigma associated with mental illness.  Patients were asked to give examples of experiences surrounding living with a mental illness and how it makes them feel. Patients were asked to self-reflect on how they have chosen to address their mental health thus far and were invited to share those lessons or to discuss how stigma has affected their mental health care. Patients actively explore how their mental illness has impacted decisions and actions as well as how future decisions can be impacted.  Therapeutic Goals: 1. Patients will identify what can be considered mental illness 2. Patients will identify lessons learned from past experiences and how they can be applied to future struggles. 3. Patients will establish rapport with peers in a therapeutic setting.  Summary of Patient Progress:  Jermarcus did not attend group.  Therapeutic Modalities:   Cognitive Behavioral Therapy    Jacinta Shoe, LCSW 03/04/2020  1:58 PM

## 2020-03-04 NOTE — Progress Notes (Signed)
   03/04/20 2024  Psych Admission Type (Psych Patients Only)  Admission Status Involuntary  Psychosocial Assessment  Patient Complaints None  Eye Contact Brief  Facial Expression Sad  Affect Sad  Speech Logical/coherent  Interaction Assertive  Motor Activity Other (Comment) (WDL)  Appearance/Hygiene Unremarkable  Behavior Characteristics Cooperative;Appropriate to situation  Mood Sad;Depressed  Thought Process  Coherency WDL  Content WDL  Delusions None reported or observed  Perception WDL  Hallucination None reported or observed  Judgment WDL  Confusion None  Danger to Self  Current suicidal ideation? Denies  Danger to Others  Danger to Others None reported or observed  D: Patient presents with sad affect but is pleasant upon interaction. Patient reports he is feeling a lot better than he has been. Patient denies SI/HI at this time. Patient also denies AH/VH at this time. Patient contracts for safety.  A: Provided positive reinforcement and encouragement.  R: Patient cooperative and receptive to efforts. Patient remains safe on the unit.

## 2020-03-04 NOTE — Progress Notes (Signed)
The patient attended the A.A.meeting and was appropriate.  

## 2020-03-04 NOTE — Progress Notes (Signed)
Recreation Therapy Notes  Date:  11.26.21 Time: 0930 Location: 300 Hall Group Room  Group Topic: Stress Management  Goal Area(s) Addresses:  Patient will identify positive stress management techniques. Patient will identify benefits of using stress management post d/c.  Intervention: Stress Management  Activity: Meditation.  LRT played a meditation that focused on making the most of your day and the possibilities that possibly await as the day goes on.    Education:  Stress Management, Discharge Planning.   Education Outcome: Acknowledges Education  Clinical Observations/Feedback: Pt did not attend group session.    Caroll Rancher, LRT/CTRS         Lillia Abed, Chermaine Schnyder A 03/04/2020 11:30 AM

## 2020-03-04 NOTE — Tx Team (Signed)
Interdisciplinary Treatment and Diagnostic Plan Update  03/04/2020 Time of Session: 9:00am John Leblanc MRN: [id number]  Principal Diagnosis: Substance induced mood disorder (HCC)  Secondary Diagnoses: Principal Problem:   Substance induced mood disorder (HCC) Active Problems:   Schizoaffective disorder (HCC)   Substance use disorder   Cocaine abuse (HCC)   Marijuana abuse   Current Medications:  Current Facility-Administered Medications  Medication Dose Route Frequency Provider Last Rate Last Admin  . acetaminophen (TYLENOL) tablet 650 mg  650 mg Oral Q6H PRN Nira Conn A, NP   650 mg at 03/03/20 1259  . alum & mag hydroxide-simeth (MAALOX/MYLANTA) 200-200-20 MG/5ML suspension 30 mL  30 mL Oral Q4H PRN Nira Conn A, NP      . cloNIDine (CATAPRES) tablet 0.1 mg  0.1 mg Oral BID Eliseo Gum B, MD   0.1 mg at 03/04/20 1004  . hydrOXYzine (ATARAX/VISTARIL) tablet 25 mg  25 mg Oral TID PRN Jackelyn Poling, NP   25 mg at 03/03/20 2102  . magnesium hydroxide (MILK OF MAGNESIA) suspension 30 mL  30 mL Oral Daily PRN Nira Conn A, NP      . nicotine (NICODERM CQ - dosed in mg/24 hours) patch 21 mg  21 mg Transdermal Daily Antonieta Pert, MD   21 mg at 03/04/20 1007  . OLANZapine (ZYPREXA) tablet 5 mg  5 mg Oral QHS Eliseo Gum B, MD   5 mg at 03/03/20 2102  . traZODone (DESYREL) tablet 50 mg  50 mg Oral QHS PRN Jackelyn Poling, NP   50 mg at 03/03/20 2102   PTA Medications: Medications Prior to Admission  Medication Sig Dispense Refill Last Dose  . gabapentin (NEURONTIN) 100 MG capsule Take 2 capsules (200 mg total) by mouth 2 (two) times daily. (Patient not taking: Reported on 01/08/2018) 60 capsule 0   . pantoprazole (PROTONIX) 40 MG tablet Take 1 tablet (40 mg total) by mouth daily. (Patient not taking: Reported on 03/02/2020) 30 tablet 0     Patient Stressors: Medication change or noncompliance  Patient Strengths: General fund of knowledge  Treatment Modalities:  Medication Management, Group therapy, Case management,  1 to 1 session with clinician, Psychoeducation, Recreational therapy.   Physician Treatment Plan for Primary Diagnosis: Substance induced mood disorder (HCC) Long Term Goal(s): Improvement in symptoms so as ready for discharge   Short Term Goals: Ability to identify changes in lifestyle to reduce recurrence of condition will improve Ability to verbalize feelings will improve Ability to disclose and discuss suicidal ideas Ability to demonstrate self-control will improve Ability to identify and develop effective coping behaviors will improve Ability to maintain clinical measurements within normal limits will improve Compliance with prescribed medications will improve  Medication Management: Evaluate patient's response, side effects, and tolerance of medication regimen.  Therapeutic Interventions: 1 to 1 sessions, Unit Group sessions and Medication administration.  Evaluation of Outcomes: Progressing  Physician Treatment Plan for Secondary Diagnosis: Principal Problem:   Substance induced mood disorder (HCC) Active Problems:   Schizoaffective disorder (HCC)   Substance use disorder   Cocaine abuse (HCC)   Marijuana abuse  Long Term Goal(s): Improvement in symptoms so as ready for discharge   Short Term Goals: Ability to identify changes in lifestyle to reduce recurrence of condition will improve Ability to verbalize feelings will improve Ability to disclose and discuss suicidal ideas Ability to demonstrate self-control will improve Ability to identify and develop effective coping behaviors will improve Ability to maintain clinical measurements within normal  limits will improve Compliance with prescribed medications will improve     Medication Management: Evaluate patient's response, side effects, and tolerance of medication regimen.  Therapeutic Interventions: 1 to 1 sessions, Unit Group sessions and Medication  administration.  Evaluation of Outcomes: Progressing   RN Treatment Plan for Primary Diagnosis: Substance induced mood disorder (HCC) Long Term Goal(s): Knowledge of disease and therapeutic regimen to maintain health will improve  Short Term Goals: Ability to remain free from injury will improve, Ability to participate in decision making will improve, Ability to verbalize feelings will improve, Ability to disclose and discuss suicidal ideas, Ability to identify and develop effective coping behaviors will improve and Compliance with prescribed medications will improve  Medication Management: RN will administer medications as ordered by provider, will assess and evaluate patient's response and provide education to patient for prescribed medication. RN will report any adverse and/or side effects to prescribing provider.  Therapeutic Interventions: 1 on 1 counseling sessions, Psychoeducation, Medication administration, Evaluate responses to treatment, Monitor vital signs and CBGs as ordered, Perform/monitor CIWA, COWS, AIMS and Fall Risk screenings as ordered, Perform wound care treatments as ordered.  Evaluation of Outcomes: Progressing   LCSW Treatment Plan for Primary Diagnosis: Substance induced mood disorder (HCC) Long Term Goal(s): Safe transition to appropriate next level of care at discharge, Engage patient in therapeutic group addressing interpersonal concerns.  Short Term Goals: Engage patient in aftercare planning with referrals and resources, Increase social support, Increase emotional regulation, Facilitate acceptance of mental health diagnosis and concerns, Identify triggers associated with mental health/substance abuse issues and Increase skills for wellness and recovery  Therapeutic Interventions: Assess for all discharge needs, 1 to 1 time with Social worker, Explore available resources and support systems, Assess for adequacy in community support network, Educate family and  significant other(s) on suicide prevention, Complete Psychosocial Assessment, Interpersonal group therapy.  Evaluation of Outcomes: Progressing   Progress in Treatment: Attending groups: Yes. Participating in groups: Yes. Taking medication as prescribed: Yes. Toleration medication: Yes. Family/Significant other contact made: Yes, individual(s) contacted:  Mother  Patient understands diagnosis: Yes. and No. Discussing patient identified problems/goals with staff: Yes. Medical problems stabilized or resolved: Yes. Denies suicidal/homicidal ideation: Yes. Issues/concerns per patient self-inventory: No.   New problem(s) identified: No, Describe:  None   New Short Term/Long Term Goal(s): medication stabilization, elimination of SI thoughts, development of comprehensive mental wellness plan.   Patient Goals:  "To make better decisions"  Discharge Plan or Barriers: Patient recently admitted. CSW will continue to follow and assess for appropriate referrals and possible discharge planning.   Reason for Continuation of Hospitalization: Depression Medication stabilization Suicidal ideation  Estimated Length of Stay: 3 to 5 days   Attendees: Patient: John Leblanc 03/04/2020   Physician: Gretta Cool, MD 03/04/2020   Nursing:  03/04/2020   RN Care Manager: 03/04/2020   Social Worker: Melba Coon, LCSWA 03/04/2020   Recreational Therapist:  03/04/2020   Other:  03/04/2020   Other:  03/04/2020   Other: 03/04/2020     Scribe for Treatment Team: Aram Beecham, LCSWA 03/04/2020 11:23 AM

## 2020-03-04 NOTE — [facility] Suicide Risk Assessment (Signed)
[facility] INPATIENT:  Family/Significant Other Suicide Prevention Education  Suicide Prevention Education:  Education Completed; Orland Visconti [phone], mother, has been identified by the patient as the family member/significant other with whom the patient will be residing, and identified as the person(s) who will aid the patient in the event of a mental health crisis (suicidal ideations/suicide attempt).  With written consent from the patient, the family member/significant other has been provided the following suicide prevention education, prior to the and/or following the discharge of the patient.  The suicide prevention education provided includes the following:  Suicide risk factors  Suicide prevention and interventions  National Suicide Hotline telephone number  [facility] assessment telephone number  [facility] Emergency Assistance 911  [facility] and/or Residential Mobile Crisis Unit telephone number  Request made of family/significant other to:  Remove weapons (e.g., guns, rifles, knives), all items previously/currently identified as safety concern.    Remove drugs/medications (over-the-counter, prescriptions, illicit drugs), all items previously/currently identified as a safety concern.  CSW spoke with Pt's mother Joann Finely. She reported that Pt is able to return to her home, despite having some thoughts of not being able to return. She stated that she has constantly provided encouragement to Pt. She feels he need services for depression, drug abuse and domestic issues. She did confirm that the home is walking distance to the [facility]. CSW explained that he has been given walk-in information to begin services a the [facility]. Mother did confirm that weapons have been removed from the home.   The family member/significant other verbalizes understanding of the suicide prevention education information provided. The family member/significant other agrees to remove the  items of safety concern listed above.  Joelyn Oms Tannor Pyon, LCSW 03/04/2020, 11:50 AM

## 2020-03-04 NOTE — Progress Notes (Addendum)
[facility] MD Progress Note  03/04/2020 11:07 AM John Leblanc  MRN:  [id number] Subjective:  Patient remains compliant with his medications. Patient reports that his mood this AM is "good" and that he slept "alright" overnight. Patient reports that he is no longer having SI and has not seen any visual hallucinations. Patient continues to have a good appetite. Discussed with patient his hallucination of his stillborn daughter. Patient reports that the daughter was stillborn in 2014 and he has never received therapy for this. He has 2 other children ages 27 and 65 and he sees them approx 3x a week. He enjoys his time with them. Patient reports that all 3 children have the same mother. When asked about depression patient remarks that he was on multiple anti-depressants, but can only remember that he was supposed to be taking Zoloft by name. Patient reports that he does not have any issues with his day to day task and feels motivated with good concentration to complete his work. Patient reports that he would like to get therapy and is interested in following up at [facility]. He reports that this is easier access as he lives near the facility.  Principal Problem: Substance induced mood disorder (HCC) Diagnosis: Principal Problem:   Substance induced mood disorder (HCC) Active Problems:   Schizoaffective disorder (HCC)   Substance use disorder   Cocaine abuse (HCC)   Marijuana abuse  Total Time spent with patient: 20 minutes  Past Psychiatric History: See H&P  Past Medical History:  Past Medical History:  Diagnosis Date  . Bipolar affective (HCC)   . PTSD (post-traumatic stress disorder)   . Schizoaffective disorder (HCC)    History reviewed. No pertinent surgical history. Family History: History reviewed. No pertinent family history. Family Psychiatric  History: See H&P Social History:  Social History   Substance and Sexual Activity  Alcohol Use Yes     Social History   Substance and Sexual  Activity  Drug Use Yes  . Types: Marijuana    Social History   Socioeconomic History  . Marital status: Single    Spouse name: Not on file  . Number of children: Not on file  . Years of education: Not on file  . Highest education level: Not on file  Occupational History  . Not on file  Tobacco Use  . Smoking status: Current Every Day Smoker    Packs/day: 0.50  . Smokeless tobacco: Current User  Substance and Sexual Activity  . Alcohol use: Yes  . Drug use: Yes    Types: Marijuana  . Sexual activity: Not on file  Other Topics Concern  . Not on file  Social History Narrative   ** Merged History Encounter **       Social Determinants of Health   Financial Resource Strain:   . Difficulty of Paying Living Expenses: Not on file  Food Insecurity:   . Worried About Programme researcher, broadcasting/film/video in the Last Year: Not on file  . Ran Out of Food in the Last Year: Not on file  Transportation Needs:   . Lack of Transportation (Medical): Not on file  . Lack of Transportation (Non-Medical): Not on file  Physical Activity:   . Days of Exercise per Week: Not on file  . Minutes of Exercise per Session: Not on file  Stress:   . Feeling of Stress : Not on file  Social Connections:   . Frequency of Communication with Friends and Family: Not on file  . Frequency  of Social Gatherings with Friends and Family: Not on file  . Attends Religious Services: Not on file  . Active Member of Clubs or Organizations: Not on file  . Attends BankerClub or Organization Meetings: Not on file  . Marital Status: Not on file   Additional Social History:                         Sleep: Good  Appetite:  Good  Current Medications: Current Facility-Administered Medications  Medication Dose Route Frequency Provider Last Rate Last Admin  . acetaminophen (TYLENOL) tablet 650 mg  650 mg Oral Q6H PRN Nira ConnBerry, Jason A, NP   650 mg at 03/03/20 1259  . alum & mag hydroxide-simeth (MAALOX/MYLANTA) 200-200-20 MG/5ML  suspension 30 mL  30 mL Oral Q4H PRN Nira ConnBerry, Jason A, NP      . cloNIDine (CATAPRES) tablet 0.1 mg  0.1 mg Oral BID Eliseo GumMcQuilla, Clema Skousen B, MD   0.1 mg at 03/04/20 1004  . hydrOXYzine (ATARAX/VISTARIL) tablet 25 mg  25 mg Oral TID PRN Jackelyn PolingBerry, Jason A, NP   25 mg at 03/03/20 2102  . magnesium hydroxide (MILK OF MAGNESIA) suspension 30 mL  30 mL Oral Daily PRN Nira ConnBerry, Jason A, NP      . nicotine (NICODERM CQ - dosed in mg/24 hours) patch 21 mg  21 mg Transdermal Daily Antonieta Pertlary, Greg Lawson, MD   21 mg at 03/04/20 1007  . OLANZapine (ZYPREXA) tablet 5 mg  5 mg Oral QHS Eliseo GumMcQuilla, Cay[place] Brogden B, MD   5 mg at 03/03/20 2102  . traZODone (DESYREL) tablet 50 mg  50 mg Oral QHS PRN Jackelyn PolingBerry, Jason A, NP   50 mg at 03/03/20 2102    Lab Results:  Results for orders placed or performed during the hospital encounter of 03/02/20 (from the past 48 hour(s))  Hemoglobin A1c     Status: None   Collection Time: 03/03/20  6:32 AM  Result Value Ref Range   Hgb A1c MFr Bld 5.2 4.8 - 5.6 %    Comment: (NOTE) Pre diabetes:          5.7%-6.4%  Diabetes:              >6.4%  Glycemic control for   <7.0% adults with diabetes    Mean Plasma Glucose 102.54 mg/dL    Comment: Performed at [facility], [address] [place], [place] [address]  Lipid panel     Status: None   Collection Time: 03/03/20  6:32 AM  Result Value Ref Range   Cholesterol 159 0 - 200 mg/dL   Triglycerides 88 <213<150 mg/dL   HDL 56 >08>40 mg/dL   Total CHOL/HDL Ratio 2.8 RATIO   VLDL 18 0 - 40 mg/dL   LDL Cholesterol 85 0 - 99 mg/dL    Comment:        Total Cholesterol/HDL:CHD Risk Coronary Heart Disease Risk Table                     Men   Women  1/2 Average Risk   3.4   3.3  Average Risk       5.0   4.4  2 X Average Risk   9.6   7.1  3 X Average Risk  23.4   11.0        Use the calculated Patient Ratio above and the CHD Risk Table to determine the patient's CHD Risk.  ATP III CLASSIFICATION (LDL):  <100     mg/dL   Optimal  161-096  mg/dL    Near or Above                    Optimal  130-159  mg/dL   Borderline  045-409  mg/dL   High  >811     mg/dL   Very High Performed at [facility], [address] [place], [place] [address]   TSH     Status: None   Collection Time: 03/03/20  6:32 AM  Result Value Ref Range   TSH 0.697 0.350 - 4.500 uIU/mL    Comment: Performed by a 3rd Generation assay with a functional sensitivity of <=0.01 uIU/mL. Performed at [facility], [address], [place], [place] [address]     Blood Alcohol level:  Lab Results  Component Value Date   ETH 189 (H) 03/02/2020   ETH 114 (H) 02/01/2017    Metabolic Disorder Labs: Lab Results  Component Value Date   HGBA1C 5.2 03/03/2020   MPG 102.54 03/03/2020   No results found for: PROLACTIN Lab Results  Component Value Date   CHOL 159 03/03/2020   TRIG 88 03/03/2020   HDL 56 03/03/2020   CHOLHDL 2.8 03/03/2020   VLDL 18 03/03/2020   LDLCALC 85 03/03/2020    Physical Findings: AIMS:  , ,  ,  ,    CIWA:    COWS:     Musculoskeletal: Strength & Muscle Tone: within normal limits Gait & Station: normal Patient leans: N/A  Psychiatric Specialty Exam: Physical Exam HENT:     Head: Normocephalic and atraumatic.  Eyes:     Conjunctiva/sclera: Conjunctivae normal.  Pulmonary:     Effort: Pulmonary effort is normal.  Neurological:     Mental Status: He is alert.     Review of Systems  Constitutional: Negative for appetite change.  Cardiovascular: Negative for chest pain.  Gastrointestinal: Negative for abdominal pain.    Blood pressure (!) 134/102, pulse (!) 54, temperature 97.8 F (36.6 C), temperature source Oral, resp. rate 16, height 5\' 6"  (1.676 m), weight 68 kg, SpO2 100 %.Body mass index is 24.21 kg/m.  General Appearance: Casual  Eye Contact:  Minimal  Speech:  Clear and Coherent  Volume:  Normal  Mood:  Euthymic  Affect:  Appropriate  Thought Process:  Coherent  Orientation:   NA  Thought Content:  Logical  Suicidal Thoughts:  No  Homicidal Thoughts:  No  Memory:  NA  Judgement:  Fair  Insight:  Fair  Psychomotor Activity:  Normal  Concentration:  Concentration: Good  Recall:  NA  Fund of Knowledge:  Fair  Language:  Good  Akathisia:  No  Handed:  Right  AIMS (if indicated):     Assets:  Desire for Improvement Housing Social Support  ADL's:  Intact  Cognition:  WNL  Sleep:  Number of Hours: 5.75     Treatment Plan Summary: Daily contact with patient to assess and evaluate symptoms and progress in treatment. Patient doing well this AM. Patient reports that he will attempt to attend some groups today. Spoke with patient about his illicit substance use and it's chances of causing his psychosis. Patient seemed to understand. Patient was initially speaking in one word answers, but became more expressive during conversation. Patient reports that he is very interested in therapy and continuing his outpatient psychiatry treatment. Patient disclosed that he had been cut from  [facility] as he had missed too many appts. Patient reported that it was difficult for him to access their facilities, but he happy to hear about [facility] as this is much closer and accessible to his home.  Patient reports a history of depression and endorses that he would like some therapy but patient also reports that he felt that he did better on Zoloft. Will restart to address mood.  Patient feels that if he can see someone outpatient again he will do much better and be able to find more stable work.  Substance induced mood disorder Hx. Of schizoaffective disorder Cocaine use disorder Marijuana use disorder Hx of depression  Scheduled - olanzapine 5mg  QHS, AH and mood augmentation - clonindine 0.1mg  BID, HTN - Nicotine patch 21 mg, tobacco use disorder - Restart Zoloft 100mg , mood tx  PRN -Tylenol 650mg  q6h -Maalox 48ml q4h -Atarax 25mg  TID -Milk of Mag 76mL -Trazodone 50mg   QHS  PGY-1 , MD 03/04/2020, 11:07 AM

## 2020-03-05 NOTE — [facility] Group Notes (Signed)
Psychoeducational Group Note  Date: 03-05-20 Time: 0900-1000    Goal Setting   Purpose of Group: This group helps to provide patients with the steps of setting a goal that is specific, measurable, attainable, realistic and time specific. A discussion on how we keep ourselves stuck with negative self talk.    Participation Level:  Did not attend   John Leblanc A  

## 2020-03-05 NOTE — Progress Notes (Signed)
[place] NOVEL CORONAVIRUS (COVID-19) DAILY CHECK-OFF SYMPTOMS - answer yes or no to each - every day NO YES  Have you had a fever in the past 24 hours?  . Fever (Temp > 37.80C / 100F) X   Have you had any of these symptoms in the past 24 hours? . New Cough .  Sore Throat  .  Shortness of Breath .  Difficulty Breathing .  Unexplained Body Aches   X   Have you had any one of these symptoms in the past 24 hours not related to allergies?   . Runny Nose .  Nasal Congestion .  Sneezing   X   If you have had runny nose, nasal congestion, sneezing in the past 24 hours, has it worsened?  X   EXPOSURES - check yes or no X   Have you traveled outside the state in the past 14 days?  X   Have you been in contact with someone with a confirmed diagnosis of COVID-19 or PUI in the past 14 days without wearing appropriate PPE?  X   Have you been living in the same home as a person with confirmed diagnosis of COVID-19 or a PUI (household contact)?    X   Have you been diagnosed with COVID-19?    X              What to do next: Answered NO to all: Answered YES to anything:   Proceed with unit schedule Follow the [facility] Inpatient Flowsheet.   
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
## 2020-03-05 NOTE — Progress Notes (Signed)
   03/05/20 1800  Psych Admission Type (Psych Patients Only)  Admission Status Involuntary  Psychosocial Assessment  Patient Complaints None  Eye Contact Brief  Facial Expression Sad  Affect Blunted  Speech Logical/coherent  Interaction Assertive  Motor Activity Other (Comment) (WDL)  Appearance/Hygiene Unremarkable  Behavior Characteristics Cooperative;Appropriate to situation  Mood Sad;Pleasant  Thought Process  Coherency WDL  Content WDL  Delusions None reported or observed  Perception WDL  Hallucination None reported or observed  Judgment WDL  Confusion None  Danger to Self  Current suicidal ideation? Denies  Self-Injurious Behavior No self-injurious ideation or behavior indicators observed or expressed   Danger to Others  Danger to Others None reported or observed
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
## 2020-03-06 MED ORDER — HYDROXYZINE HCL 25 MG PO TABS: 25.0000 mg | ORAL_TABLET | Freq: Three times a day (TID) | ORAL | 0 refills | Status: AC | PRN

## 2020-03-06 MED ORDER — MIRTAZAPINE 15 MG PO TABS
15.0000 mg | ORAL_TABLET | Freq: Every day | ORAL | Status: DC
  Filled 2020-03-06: qty 1
  Filled 2020-03-06: qty 7

## 2020-03-06 MED ORDER — TRAZODONE HCL 50 MG PO TABS
50.0000 mg | ORAL_TABLET | Freq: Every evening | ORAL | 0 refills | Status: DC | PRN
Start: 2020-03-06 — End: 2020-03-06

## 2020-03-06 MED ORDER — OLANZAPINE 5 MG PO TABS: 5.0000 mg | ORAL_TABLET | Freq: Every day | ORAL | 0 refills | Status: AC

## 2020-03-06 MED ORDER — MIRTAZAPINE 15 MG PO TABS: 15.0000 mg | ORAL_TABLET | Freq: Every day | ORAL | 0 refills | Status: AC

## 2020-03-06 MED ORDER — SERTRALINE HCL 100 MG PO TABS: 100.0000 mg | ORAL_TABLET | Freq: Every day | ORAL | 0 refills | Status: AC

## 2020-03-06 MED ORDER — CLONIDINE HCL 0.1 MG PO TABS: 0.1000 mg | ORAL_TABLET | Freq: Two times a day (BID) | ORAL | 0 refills | Status: AC

## 2020-03-06 NOTE — Progress Notes (Signed)
   03/06/20 1000  Psych Admission Type (Psych Patients Only)  Admission Status Involuntary  Psychosocial Assessment  Patient Complaints None  Eye Contact Brief  Facial Expression Sad  Affect Blunted  Speech Logical/coherent  Interaction Assertive  Motor Activity Other (Comment) (WDL)  Appearance/Hygiene Unremarkable  Behavior Characteristics Cooperative;Appropriate to situation  Mood Anxious;Pleasant  Thought Process  Coherency WDL  Content WDL  Delusions None reported or observed  Perception WDL  Hallucination None reported or observed  Judgment WDL  Confusion None  Danger to Self  Current suicidal ideation? Denies  Self-Injurious Behavior No self-injurious ideation or behavior indicators observed or expressed   Agreement Not to Harm Self Yes  Description of Agreement Verbal agreement   Danger to Others  Danger to Others None reported or observed

## 2020-03-06 NOTE — Discharge Summary (Signed)
Physician Discharge Summary Note  Patient:  John Leblanc is an 34 y.o., male MRN:  [id number] DOB:  08-Nov-1985 Patient phone:  [phone] (home)  Patient address:   [address] [place] [address],  Total Time spent with patient: 20 minutes  Date of Admission:  03/02/2020 Date of Discharge: 03/06/2020  Reason for Admission:  Acute Psychosis  Principal Problem: Substance induced mood disorder [facility]) Discharge Diagnoses: Principal Problem:   Substance induced mood disorder (HCC) Active Problems:   Alcohol abuse with alcohol-induced mood disorder (HCC)   Schizoaffective disorder (HCC)   Substance use disorder   Cocaine abuse (HCC)   Marijuana abuse   Past Psychiatric History: Please see H&P  Past Medical History:  Past Medical History:  Diagnosis Date   Bipolar affective (HCC)    PTSD (post-traumatic stress disorder)    Schizoaffective disorder (HCC)    History reviewed. No pertinent surgical history. Family History: History reviewed. No pertinent family history. Family Psychiatric  History: Unknown Social History:  Social History   Substance and Sexual Activity  Alcohol Use Yes     Social History   Substance and Sexual Activity  Drug Use Yes   Types: Marijuana    Social History   Socioeconomic History   Marital status: Single    Spouse name: Not on file   Number of children: Not on file   Years of education: Not on file   Highest education level: Not on file  Occupational History   Not on file  Tobacco Use   Smoking status: Current Every Day Smoker    Packs/day: 0.50   Smokeless tobacco: Current User  Substance and Sexual Activity   Alcohol use: Yes   Drug use: Yes    Types: Marijuana   Sexual activity: Not on file  Other Topics Concern   Not on file  Social History Narrative   ** Merged History Encounter **       Social Determinants of Health   Financial Resource Strain:    Difficulty of Paying Living  Expenses: Not on file  Food Insecurity:    Worried About Programme researcher, broadcasting/film/video in the Last Year: Not on file   [employer] of Food in the Last Year: Not on file  Transportation Needs:    Lack of Transportation (Medical): Not on file   Lack of Transportation (Non-Medical): Not on file  Physical Activity:    Days of Exercise per Week: Not on file   Minutes of Exercise per Session: Not on file  Stress:    Feeling of Stress : Not on file  Social Connections:    Frequency of Communication with Friends and Family: Not on file   Frequency of Social Gatherings with Friends and Family: Not on file   Attends Religious Services: Not on file   Active Member of Clubs or Organizations: Not on file   Attends Banker Meetings: Not on file   Marital Status: Not on file    Hospital Course:  Mr. Repass was admitted with acute psychosis. Patient was reporting visual hallucinations of his deceased child who was stillborn in 2014. UDS was found to be positive for cocaine and MJ. Patient reported hx of HTN and was noted to have HTN during hospitalization; however due to recent cocaine use and lack of EMR patient was started on clonidine 0.1mg  BID and encouraged to follow-up with PCP for reassessment. Patient also reported that he had a psychiatric hx with [facility], but had  been let go from their system for frequent no-shows.  Patient reported that he had been on several medications but had trouble remembering all of their names. Patient was started on Zyprexa and titrated up to 5mg  and responded well. Patient main dx this stay was Substance induced psychosis; however given his history of schizoaffective disorder his Zyprexa was continued. Patient also showed signs of depression and endorsed that he had been treated for depression at [facility] in the past. Patient remembered Zoloft worked well and he was restarted on 100mg  Zoloft. Patient was restarted on Remeron for sleep as he felt the trazodone did  not help and reported success in the past with Remeron.  Patient reported that [facility] was close to his home and he felt that he would be able to be compliant with his medications and therapy as the facility is more accessible than [facility] for him.  Physical Findings: AIMS:  , ,  ,  ,    CIWA:    COWS:     Musculoskeletal: Strength & Muscle Tone: within normal limits Gait & Station: normal Patient leans: N/A  Psychiatric Specialty Exam: Physical Exam  Review of Systems  Blood pressure 128/79, pulse (!) 53, temperature 97.7 F (36.5 C), temperature source Oral, resp. rate 16, height 5\' 6"  (1.676 m), weight 68 kg, SpO2 100 %.Body mass index is 24.21 kg/m.  General Appearance: Casual  Eye Contact:  Fair  Speech:  Clear and Coherent  Volume:  Normal  Mood:  Euthymic  Affect:  Congruent  Thought Process:  Coherent  Orientation:  NA  Thought Content:  Logical  Suicidal Thoughts:  No  Homicidal Thoughts:  No  Memory:  Recent;   Good  Judgement:  Intact  Insight:  Fair  Psychomotor Activity:  Normal  Concentration:  Concentration: Fair  Recall:  NA  Fund of Knowledge:  Good  Language:  Good  Akathisia:  No  Handed:  Right  AIMS (if indicated):     Assets:  Communication Skills Desire for Improvement Housing  ADL's:  Intact  Cognition:  WNL  Sleep:  Number of Hours: 6.75     Have you used any form of tobacco in the last 30 days? (Cigarettes, Smokeless Tobacco, Cigars, and/or Pipes): Yes    Blood Alcohol level:  Lab Results  Component Value Date   ETH 189 (H) 03/02/2020   ETH 114 (H) 02/01/2017    Metabolic Disorder Labs:  Lab Results  Component Value Date   HGBA1C 5.2 03/03/2020   MPG 102.54 03/03/2020   No results found for: PROLACTIN Lab Results  Component Value Date   CHOL 159 03/03/2020   TRIG 88 03/03/2020   HDL 56 03/03/2020   CHOLHDL 2.8 03/03/2020   VLDL 18 03/03/2020   LDLCALC 85 03/03/2020    See Psychiatric Specialty Exam and Suicide Risk  Assessment completed by Attending Physician prior to discharge.  Discharge destination:  Home  Is patient on multiple antipsychotic therapies at discharge:  No   Has Patient had three or more failed trials of antipsychotic monotherapy by history:  No  Recommended Plan for Multiple Antipsychotic Therapies: NA   Allergies as of 03/06/2020      Reactions   Bee Venom Swelling      Medication List    STOP taking these medications   gabapentin 100 MG capsule Commonly known as: NEURONTIN   pantoprazole 40 MG tablet Commonly known as: Protonix     TAKE these medications     Indication  cloNIDine 0.1 MG tablet Commonly known as: CATAPRES Take 1 tablet (0.1 mg total) by mouth 2 (two) times daily.  Indication: High Blood Pressure Disorder   hydrOXYzine 25 MG tablet Commonly known as: ATARAX/VISTARIL Take 1 tablet (25 mg total) by mouth 3 (three) times daily as needed for anxiety.  Indication: Feeling Anxious   mirtazapine 15 MG tablet Commonly known as: REMERON Take 1 tablet (15 mg total) by mouth at bedtime.  Indication: sleep/anxiety/depression   OLANZapine 5 MG tablet Commonly known as: ZYPREXA Take 1 tablet (5 mg total) by mouth at bedtime.  Indication: mood   sertraline 100 MG tablet Commonly known as: ZOLOFT Take 1 tablet (100 mg total) by mouth daily. Start taking on: March 07, 2020  Indication: Generalized Anxiety Disorder, Major Depressive Disorder       Follow-up Information    [facility]. Go on 03/07/2020.   Specialty: Behavioral Health Why: Please use walk-in hours from 8am to 5pm to receive therapy and medication management. Please go within 7 days of hospital discharge. Contact information: [address] [place] [address] [phone]              Follow-up recommendations: Follow up recommendations: - Activity as tolerated. - Diet as recommended by PCP. - Keep all scheduled follow-up  appointments as recommended.   Comments:  Patient is instructed to take all prescribed medications as recommended. Report any side effects or adverse reactions to your outpatient psychiatrist. Patient is instructed to abstain from alcohol and illegal drugs while on prescription medications. In the event of worsening symptoms, patient is instructed to call the crisis hotline, 911, or go to the nearest emergency department for evaluation and treatment.    Signed: Bobbye Morton, MD 03/06/2020, 3:37 PM

## 2020-03-06 NOTE — BHH Group Notes (Signed)
[facility] Group Notes: (Clinical Social Work)   03/06/2020      Type of Therapy:  Group Therapy   Participation Level:  Did Not Attend - was invited both individually by MHT and by overhead announcement, chose not to attend.   Ambrose Mantle, LCSW 03/06/2020, 12:50 PM

## 2020-03-06 NOTE — Progress Notes (Signed)
[facility] NOVEL CORONAVIRUS (COVID-19) DAILY CHECK-OFF SYMPTOMS - answer yes or no to each - every day NO YES  Have you had a fever in the past 24 hours?  . Fever (Temp > 37.80C / 100F) X   Have you had any of these symptoms in the past 24 hours? . New Cough .  Sore Throat  .  Shortness of Breath .  Difficulty Breathing .  Unexplained Body Aches   X   Have you had any one of these symptoms in the past 24 hours not related to allergies?   . Runny Nose .  Nasal Congestion .  Sneezing   X   If you have had runny nose, nasal congestion, sneezing in the past 24 hours, has it worsened?  X   EXPOSURES - check yes or no X   Have you traveled outside the state in the past 14 days?  X   Have you been in contact with someone with a confirmed diagnosis of COVID-19 or PUI in the past 14 days without wearing appropriate PPE?  X   Have you been living in the same home as a person with confirmed diagnosis of COVID-19 or a PUI (household contact)?    X   Have you been diagnosed with COVID-19?    X              What to do next: Answered NO to all: Answered YES to anything:   Proceed with unit schedule Follow the [facility] Inpatient Flowsheet.   

## 2020-03-06 NOTE — Progress Notes (Signed)
  [facility] Adult Case Management Discharge Plan :  Will you be returning to the same living situation after discharge:  Yes,  with mother At discharge, do you have transportation home?: Yes,  sister will pick up Do you have the ability to pay for your medications: No.  Needs help from community agency  Release of information consent forms completed and emailed to Medical Records, then turned in to Medical Records by CSW.   Patient to Follow up at:  Follow-up Information    [facility]. Go on 03/07/2020.   Specialty: Behavioral Health Why: Please use walk-in hours from 8am to 5pm to receive therapy and medication management. Please go within 7 days of hospital discharge. Contact information: [address] [place] [address] [phone]              Next level of care provider has access to [facility] Link:yes  Safety Planning and Suicide Prevention discussed: Yes,  with mother  Have you used any form of tobacco in the last 30 days? (Cigarettes, Smokeless Tobacco, Cigars, and/or Pipes): Yes  Has patient been referred to the Quitline?: Yes, faxed on 03/06/2020  Patient has been referred for addiction treatment: Yes  Lynnell Chad, LCSW 03/06/2020, 8:56 AM

## 2020-03-06 NOTE — BHH Suicide Risk Assessment (Signed)
[facility] Discharge Suicide Risk Assessment   Principal Problem: Substance induced mood disorder (HCC) Discharge Diagnoses: Principal Problem:   Substance induced mood disorder (HCC) Active Problems:   Alcohol abuse with alcohol-induced mood disorder (HCC)   Schizoaffective disorder (HCC)   Substance use disorder   Cocaine abuse (HCC)   Marijuana abuse   Total Time spent with patient: 35 minutes   Patient is seen and evaluated.  He reports improvement with being restarted on medications.  He is denying suicidal or homicidal ideation.  He is denying auditory or visual hallucinations.  He endorses that drugs and alcohol played a role in his wanting to stab himself with a knife.  He states that he had decided to not go through with that, as he wants to live for his daughters.  He reports that he has been with the mother of his children for 20 years, and they have been through a lot, including having their middle daughter be born as a stillborn after a full-term pregnancy.  Patient also endorses current stressors of his grandmother being on hospice with cancer.  Patient is able to verbalize that he will follow-up with outpatient treatment through [facility] behavioral health urgent care clinic for medication management and therapy.  He intends to attend AA groups on [employer].  Recognizes that he needs to remove himself from people and places that reinforce drug and alcohol seeking behaviors.  He states that he has good family support.  Patient denies access to weapons.  Patient is provided with information regarding text crisis line and behavioral health urgent care clinic where he can seek care should his symptoms worsen.  He is also made aware that he can call 911 or return to nearest emergency department for worsening symptoms.   Musculoskeletal: Strength & Muscle Tone: within normal limits Gait & Station: normal Patient leans: N/A  Psychiatric Specialty Exam: Review of Systems  Constitutional:  Negative.   Respiratory: Negative.   Cardiovascular: Negative.   Gastrointestinal: Negative.   Musculoskeletal: Negative.   Psychiatric/Behavioral: Negative for agitation, behavioral problems, confusion, decreased concentration, dysphoric mood, hallucinations, self-injury, sleep disturbance and suicidal ideas. The patient is not nervous/anxious and is not hyperactive.     Blood pressure 128/79, pulse (!) 53, temperature 97.7 F (36.5 C), temperature source Oral, resp. rate 16, height 5\' 6"  (1.676 m), weight 68 kg, SpO2 100 %.Body mass index is 24.21 kg/m.  General Appearance: Casual and Neat  Eye Contact::  Good  Speech:  Clear and Coherent and Normal Rate  Volume:  Normal  Mood:  Euthymic  Affect:  Appropriate and Congruent  Thought Process:  Coherent, Linear and Descriptions of Associations: Intact  Orientation:  Full (Time, Place, and Person)  Thought Content:  Logical and Hallucinations: None  Suicidal Thoughts:  No  Homicidal Thoughts:  No  Memory:  Immediate;   Good Recent;   Good Remote;   Good  Judgement:  Fair  Insight:  Fair  Psychomotor Activity:  Normal  Concentration:  Good  Recall:  Good  Fund of Knowledge:Good  Language: Good  Akathisia:  No  Handed:  Right  AIMS (if indicated):     Assets:  Communication Skills Desire for Improvement Financial Resources/Insurance Housing Intimacy Physical Health Resilience Social Support Vocational/Educational  Sleep:  Number of Hours: 6.75  Cognition: WNL  ADL's:  Intact   Mental Status Per Nursing Assessment::   On Admission:  Self-harm thoughts  Demographic Factors:  Male  Loss Factors: Grandmother dying of cancer  Historical  Factors: Prior suicide attempts, Family history of mental illness or substance abuse and Impulsivity  Risk Reduction Factors:   Responsible for children under 77 years of age, Sense of responsibility to family, Employed, Living with another person, especially a relative, Positive  social support, Positive therapeutic relationship and Positive coping skills or problem solving skills  Continued Clinical Symptoms:  Depression:   Impulsivity Alcohol/Substance Abuse/Dependencies  In early remission  Cognitive Features That Contribute To Risk:  None    Suicide Risk:  Minimal: No identifiable suicidal ideation.  Patients presenting with no risk factors but with morbid ruminations; may be classified as minimal risk based on the severity of the depressive symptoms   Follow-up Information    [facility]. Go on 03/07/2020.   Specialty: Behavioral Health Why: Please use walk-in hours from 8am to 5pm to receive therapy and medication management. Please go within 7 days of hospital discharge. Contact information: [address] [place] [address] [phone]              Plan Of Care/Follow-up recommendations:  Activity:  Ad lib. Diet:  As tolerated   On day of discharge following sustained improvement in the affect of this patient, continued report of euthymic mood, repeated denial of suicidal, homicidal, and other violent ideation, adequate interaction with peers, active participation in groups while on the unit, and denial of adverse reactions from medications, the treatment team decided John Leblanc was stable for discharge home with scheduled mental health treatment as noted above.  He was able to engage in safety planning including plan to return to [facility] or contact emergency services if he feels unable to maintain his own safety or the safety of others. Patient had no further questions, comments, or concerns. Discharge into care of sister, who agrees to maintain patient safety.  Patient aware to return to nearest crisis center, ED or to call 911 for worsening symptoms of depression, suicidal or homicidal thoughts or AVH.    Mariel Craft, MD 03/06/2020, 12:25 PM

## 2020-03-06 NOTE — BHH Group Notes (Signed)
Adult Psychoeducational Group Not Date:  03/06/2020 Time:  0900-1045 Group Topic/Focus: PROGRESSIVE RELAXATION. Leblanc group where deep breathing is taught and tensing and relaxation muscle groups is used. Imagery is used as well.  Pts are asked to imagine 3 pillars that hold them up when they are not able to hold themselves up.  Participation Level:  Did not attend   John Leblanc 03/06/2020`  

## 2020-03-06 NOTE — Progress Notes (Signed)
Pt discharged to lobby- Pt's sister present to take patient home. Pt was stable and appreciative at that time. All papers, samples and prescriptions (electronic) were given and valuables returned. Verbal understanding expressed. Denies SI/HI and A/VH. Pt given opportunity to express concerns and ask questions.

## 2020-07-26 ENCOUNTER — Ambulatory Visit: Payer: Self-pay | Admitting: Family Medicine

## 2020-08-26 ENCOUNTER — Ambulatory Visit: Payer: Self-pay | Admitting: Family Medicine

## 2022-01-24 ENCOUNTER — Encounter (HOSPITAL_COMMUNITY): Payer: Self-pay | Admitting: Emergency Medicine

## 2022-01-24 ENCOUNTER — Ambulatory Visit (HOSPITAL_COMMUNITY)
Admission: EM | Admit: 2022-01-24 | Discharge: 2022-01-24 | Disposition: A | Discharge status: Home / Self Care | Payer: Self-pay | Attending: Emergency Medicine | Admitting: Emergency Medicine

## 2022-01-24 DIAGNOSIS — K122 Cellulitis and abscess of mouth: Secondary | ICD-10-CM

## 2022-01-24 MED ORDER — AMOXICILLIN-POT CLAVULANATE 875-125 MG PO TABS: 1.0000 | ORAL_TABLET | Freq: Two times a day (BID) | ORAL | 0 refills | Status: AC

## 2022-01-24 MED ORDER — IBUPROFEN 800 MG PO TABS: 800.0000 mg | ORAL_TABLET | Freq: Three times a day (TID) | ORAL | 0 refills | Status: AC

## 2022-01-24 MED ORDER — KETOROLAC TROMETHAMINE 30 MG/ML IJ SOLN
INTRAMUSCULAR | Status: AC
  Filled 2022-01-24: qty 1

## 2022-01-24 MED ORDER — KETOROLAC TROMETHAMINE 30 MG/ML IJ SOLN
30.0000 mg | Freq: Once | INTRAMUSCULAR | Status: AC
  Administered 2022-01-24: 30 mg via INTRAMUSCULAR

## 2022-01-24 MED ORDER — NYSTATIN 100000 UNIT/ML MT SUSP: 5.0000 mL | Freq: Four times a day (QID) | OROMUCOSAL | 0 refills | Status: AC | PRN

## 2022-01-24 NOTE — ED Provider Notes (Signed)
[facility]    CSN: [id number] Arrival date & time: 01/24/22  1646      History   Chief Complaint Chief Complaint  Patient presents with   Abscess    HPI CAILIN TOOR is a 36 y.o. male.   Patient presents with a evaluation for left facial swelling and pain, fever, chills beginning 3 days ago.  Pain has been constant and is worsening in severity, has began to interfere with sleep and has made it difficult to eat.  Endorses need for dental work.  Has attempted use of ibuprofen and Tylenol which has been ineffective.      Past Medical History:  Diagnosis Date   Bipolar affective ([place])    PTSD (post-traumatic stress disorder)    Schizoaffective disorder [place])     Patient Active Problem List   Diagnosis Date Noted   Substance induced mood disorder ([place]) 03/03/2020   Substance use disorder 03/03/2020   Cocaine abuse ([place]) 03/03/2020   Marijuana abuse 03/03/2020   Schizoaffective disorder ([place]) 03/02/2020   Alcohol abuse with alcohol-induced mood disorder ([place]) 02/02/2017    History reviewed. No pertinent surgical history.     Home Medications    Prior to Admission medications   Medication Sig Start Date End Date Taking? Authorizing Provider  cloNIDine (CATAPRES) 0.1 MG tablet Take 1 tablet (0.1 mg total) by mouth 2 (two) times daily. 03/06/20   Lavella Hammock, MD  hydrOXYzine (ATARAX/VISTARIL) 25 MG tablet Take 1 tablet (25 mg total) by mouth 3 (three) times daily as needed for anxiety. 03/06/20   Lavella Hammock, MD  mirtazapine (REMERON) 15 MG tablet Take 1 tablet (15 mg total) by mouth at bedtime. 03/06/20   Lavella Hammock, MD  OLANZapine (ZYPREXA) 5 MG tablet Take 1 tablet (5 mg total) by mouth at bedtime. 03/06/20   Lavella Hammock, MD  sertraline (ZOLOFT) 100 MG tablet Take 1 tablet (100 mg total) by mouth daily. 03/07/20   Lavella Hammock, MD    Family History History reviewed. No pertinent family history.  Social History Social  History   Tobacco Use   Smoking status: Every Day    Packs/day: 0.50    Types: Cigarettes   Smokeless tobacco: Current  Substance Use Topics   Alcohol use: Yes   Drug use: Yes    Types: Marijuana     Allergies   Bee venom   Review of Systems Review of Systems  Constitutional: Negative.   HENT:  Positive for facial swelling. Negative for congestion, dental problem, drooling, ear discharge, ear pain, hearing loss, mouth sores, nosebleeds, postnasal drip, rhinorrhea, sinus pressure, sinus pain, sneezing, sore throat, tinnitus, trouble swallowing and voice change.   Respiratory: Negative.    Cardiovascular: Negative.   Skin: Negative.   Neurological: Negative.      Physical Exam Triage Vital Signs ED Triage Vitals  Enc Vitals Group     BP 01/24/22 1801 (!) 157/128     Pulse Rate 01/24/22 1801 86     Resp 01/24/22 1801 18     Temp 01/24/22 1801 99.1 F (37.3 C)     Temp Source 01/24/22 1801 Oral     SpO2 01/24/22 1801 95 %     Weight --      Height --      Head Circumference --      Peak Flow --      Pain Score 01/24/22 1800 9     Pain Loc --  Pain Edu? --      Excl. in [place]? --    No data found.  Updated Vital Signs BP (!) 157/128 (BP Location: Right Arm)   Pulse 86   Temp 99.1 F (37.3 C) (Oral)   Resp 18   SpO2 95%   Visual Acuity Right Eye Distance:   Left Eye Distance:   Bilateral Distance:    Right Eye Near:   Left Eye Near:    Bilateral Near:     Physical Exam Constitutional:      Appearance: Normal appearance.  HENT:     Head: Normocephalic.     Comments: Severe left cheek swelling with abscess present, dental decay along the left lower gumline with gingival swelling, no drainage noted, pharynx is clear without obstruction Eyes:     Extraocular Movements: Extraocular movements intact.  Neurological:     Mental Status: He is alert.      UC Treatments / Results  Labs (all labs ordered are listed, but only abnormal results are  displayed) Labs Reviewed - No data to display  EKG   Radiology No results found.  Procedures Procedures (including critical care time)  Medications Ordered in UC Medications - No data to display  Initial Impression / Assessment and Plan / UC Course  I have reviewed the triage vital signs and the nursing notes.  Pertinent labs & imaging results that were available during my care of the patient were reviewed by me and considered in my medical decision making (see chart for details).  Abscess of internal left cheek  Vital signs are stable, patient appears to be in pain but is in no signs of distress, pharynx is clear without obstruction, abscess present on exam most likely related to teeth, discussed with patient, Toradol given in office and prescribed Augmentin, ibuprofen 800 mg and Magic mouthwash for outpatient, recommended additional supportive measures and given dental resources across the state for follow-up and reevaluation Final Clinical Impressions(s) / UC Diagnoses   Final diagnoses:  None   Discharge Instructions   None    ED Prescriptions   None    PDMP not reviewed this encounter.   Hans Eden, NP 01/24/22 1825

## 2022-01-24 NOTE — Discharge Instructions (Signed)
Today you are being treated for infection to your cheek most likely related to your need for dental work  Begin Augmentin every morning and every evening for 7 days, ideally you begin to see improvement after 24 to 48 hours of medicine use  Been given an injection of Toradol here in the office to help reduce your pain, once home you may take ibuprofen every 8 hours as well as Tylenol 500 to 1000 mg every 6 hours for comfort  May gargle and spit Magic mouthwash solution every 6 hours as needed to provide temporary relief  Attempt use of salt water gargles, Listerine gargles, throat lozenges, over-the-counter Chloraseptic spray, softer foods and warm liquids  increase your fluid intake until you are able to tolerate food as normal to maintain your hydration  Inside your packet is a list of dental resources, please follow-up for reevaluation

## 2022-01-24 NOTE — ED Triage Notes (Signed)
Pt reports an abscess on the left side of face x 2 days. States abscess feels like it is located inside his jaw and feels like it has a heartbeat

## 2022-07-26 ENCOUNTER — Encounter (HOSPITAL_COMMUNITY): Payer: Self-pay | Admitting: *Deleted

## 2022-07-26 ENCOUNTER — Emergency Department (HOSPITAL_COMMUNITY)

## 2022-07-26 ENCOUNTER — Emergency Department (HOSPITAL_COMMUNITY)
Admission: EM | Admit: 2022-07-26 | Discharge: 2022-07-26 | Disposition: A | Discharge status: Home / Self Care | Attending: Emergency Medicine | Admitting: Emergency Medicine

## 2022-07-26 DIAGNOSIS — S40211A Abrasion of right shoulder, initial encounter: Secondary | ICD-10-CM | POA: Insufficient documentation

## 2022-07-26 DIAGNOSIS — S4991XA Unspecified injury of right shoulder and upper arm, initial encounter: Secondary | ICD-10-CM | POA: Diagnosis present

## 2022-07-26 DIAGNOSIS — W1830XA Fall on same level, unspecified, initial encounter: Secondary | ICD-10-CM | POA: Insufficient documentation

## 2022-07-26 DIAGNOSIS — M25511 Pain in right shoulder: Secondary | ICD-10-CM

## 2022-07-26 MED ORDER — IBUPROFEN 200 MG PO TABS
600.0000 mg | ORAL_TABLET | Freq: Once | ORAL | Status: AC
  Administered 2022-07-26: 600 mg via ORAL
  Filled 2022-07-26: qty 3

## 2022-07-26 MED ORDER — ACETAMINOPHEN 500 MG PO TABS
1000.0000 mg | ORAL_TABLET | Freq: Once | ORAL | Status: AC
  Administered 2022-07-26: 1000 mg via ORAL
  Filled 2022-07-26: qty 2

## 2022-07-26 NOTE — ED Provider Notes (Signed)
[place] EMERGENCY DEPARTMENT AT [facility] Provider Note   CSN: [id number] Arrival date & time: 07/26/22  0105     History  Chief Complaint  Patient presents with   Shoulder Injury    John Leblanc is a 37 y.o. male.   Shoulder Injury  Patient is a 37 year old male with past medical history significant for PTSD, bipolar, schizoaffective  Brought in by [employer] in police custody for unspecified reason.  Seems that he was tackled to the ground by PD and fell onto his right shoulder.  He states it is severely painful and indicates that he has dislocated shoulder before and he thinks it is dislocated.  Denies any numbness or weakness in his extremities no significant head injury or loss of consciousness nausea or vomiting.  No other areas of pain per patient     Home Medications Prior to Admission medications   Medication Sig Start Date End Date Taking? Authorizing Provider  amoxicillin-clavulanate (AUGMENTIN) 875-125 MG tablet Take 1 tablet by mouth every 12 (twelve) hours. 01/24/22   White, Elita Boone, NP  cloNIDine (CATAPRES) 0.1 MG tablet Take 1 tablet (0.1 mg total) by mouth 2 (two) times daily. 03/06/20   Mariel Craft, MD  hydrOXYzine (ATARAX/VISTARIL) 25 MG tablet Take 1 tablet (25 mg total) by mouth 3 (three) times daily as needed for anxiety. 03/06/20   Mariel Craft, MD  ibuprofen (ADVIL) 800 MG tablet Take 1 tablet (800 mg total) by mouth 3 (three) times daily. 01/24/22   Valinda Hoar, NP  magic mouthwash (nystatin, lidocaine, diphenhydrAMINE, alum & mag hydroxide) suspension Swish and spit 5 mLs 4 (four) times daily as needed for mouth pain. 01/24/22   White, Elita Boone, NP  mirtazapine (REMERON) 15 MG tablet Take 1 tablet (15 mg total) by mouth at bedtime. 03/06/20   Mariel Craft, MD  OLANZapine (ZYPREXA) 5 MG tablet Take 1 tablet (5 mg total) by mouth at bedtime. 03/06/20   Mariel Craft, MD  sertraline (ZOLOFT) 100 MG tablet Take 1 tablet  (100 mg total) by mouth daily. 03/07/20   Mariel Craft, MD      Allergies    Bee venom    Review of Systems   Review of Systems  Physical Exam Updated Vital Signs BP (!) 157/112 (BP Location: Left Arm)   Pulse 88   Temp 97.6 F (36.4 C) (Oral)   Resp 16   SpO2 97%  Physical Exam Vitals and nursing note reviewed.  Constitutional:      General: He is not in acute distress.    Appearance: Normal appearance. He is not ill-appearing.  HENT:     Head: Normocephalic and atraumatic.  Eyes:     General: No scleral icterus.       Right eye: No discharge.        Left eye: No discharge.     Conjunctiva/sclera: Conjunctivae normal.  Cardiovascular:     Comments: Bilateral radial artery pulses 3+ and symmetric Pulmonary:     Effort: Pulmonary effort is normal.     Breath sounds: No stridor.  Musculoskeletal:     Comments: Diffuse tenderness palpation of the right deltoid, no clavicle tenderness able to range right shoulder Otherwise, no bony tenderness over joints or long bones of the upper and lower extremities.    No neck or back midline tenderness, step-off, deformity, or bruising. Able to turn head left and right 45 degrees without difficulty.  Full range of motion  of upper and lower extremity joints shown after palpation was conducted; with 5/5 symmetrical strength in upper and lower extremities. No chest wall tenderness, no facial or cranial tenderness.   Patient has intact sensation grossly in lower and upper extremities. Intact patellar and ankle reflexes. Patient able to ambulate without difficulty.  Radial and DP pulses palpated BL.    Skin:    Comments: Superficial abrasion of the right shoulder over the deltoid  Neurological:     Mental Status: He is alert and oriented to person, place, and time. Mental status is at baseline.     ED Results / Procedures / Treatments   Labs (all labs ordered are listed, but only abnormal results are displayed) Labs Reviewed - No  data to display  EKG None  Radiology DG Shoulder Right  Result Date: 07/26/2022 CLINICAL DATA:  Patient injured shoulder while being arrested. Top of shoulder hurts and he is unable to move it. History of dislocation. EXAM: RIGHT SHOULDER - 2+ VIEW COMPARISON:  None Available. FINDINGS: There is no evidence of fracture or dislocation. 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 Soft tissues are unremarkable. IMPRESSION: No acute fracture or dislocation. Electronically Signed   By: Minerva Fester M.D.   On: 07/26/2022 01:41    Procedures Procedures    Medications Ordered in ED Medications  acetaminophen (TYLENOL) tablet 1,000 mg (1,000 mg Oral Given 07/26/22 0329)  ibuprofen (ADVIL) tablet 600 mg (600 mg Oral Given 07/26/22 0329)    ED Course/ Medical Decision Making/ A&P                             Medical Decision Making Amount and/or Complexity of Data Reviewed Radiology: ordered.  Risk OTC drugs.   Patient is a 37 year old male with past medical history significant for PTSD, bipolar, schizoaffective  Brought in by [employer] in police custody for unspecified reason.  Seems that he was tackled to the ground by PD and fell onto his right shoulder.  He states it is severely painful and indicates that he has dislocated shoulder before and he thinks it is dislocated.  Denies any numbness or weakness in his extremities no significant head injury or loss of consciousness nausea or vomiting.  No other areas of pain per patient  PE: reassuring exam. Superficial abrasion to deltoid. FROM, DNVI in RUE.  No C, T, L-spine tenderness  X-ray without dislocation, no fractures.  Normal x-ray I personally viewed this x-ray.    Pt feels improved after tylenol/motrin. DC into [employer] custody.     Final Clinical Impression(s) / ED Diagnoses Final diagnoses:  Acute pain of right shoulder    Rx / DC Orders ED Discharge Orders     None         Gailen Shelter, [place] 07/26/22 0348    Dione Booze, MD 07/26/22 [phone]

## 2022-07-26 NOTE — ED Notes (Signed)
Coaching attempted to get pt to relax while v/s being obtained, however pt is unwilling to follow instruction. Triage RN notified.

## 2022-07-26 NOTE — Discharge Instructions (Addendum)
Tylenol and ibuprofen as discussed below.  Please use Tylenol or ibuprofen for pain.  You may use 600 mg ibuprofen every 6 hours or 1000 mg of Tylenol every 6 hours.  You may choose to alternate between the 2.  This would be most effective.  Not to exceed 4 g of Tylenol within 24 hours.  Not to exceed 3200 mg ibuprofen 24 hours.

## 2022-07-26 NOTE — ED Triage Notes (Signed)
Pt arrived with [facility] in police custody, c/o R shoulder pain, abrasion noted to shoulder. Decreased ROM
